# Patient Record
Sex: Female | Born: 1980 | Race: White | Hispanic: No | Marital: Married | Smoking: Never smoker
Health system: Southern US, Community
[De-identification: ages and names within clinical notes are randomized; demographics above are authoritative.]

## PROBLEM LIST (undated history)

## (undated) ENCOUNTER — Inpatient Hospital Stay (HOSPITAL_COMMUNITY): Payer: Self-pay

## (undated) DIAGNOSIS — O09529 Supervision of elderly multigravida, unspecified trimester: Secondary | ICD-10-CM

## (undated) DIAGNOSIS — R112 Nausea with vomiting, unspecified: Secondary | ICD-10-CM

## (undated) DIAGNOSIS — R1110 Vomiting, unspecified: Secondary | ICD-10-CM

## (undated) DIAGNOSIS — Z9889 Other specified postprocedural states: Secondary | ICD-10-CM

## (undated) DIAGNOSIS — O021 Missed abortion: Secondary | ICD-10-CM

## (undated) DIAGNOSIS — F419 Anxiety disorder, unspecified: Secondary | ICD-10-CM

## (undated) DIAGNOSIS — R51 Headache: Secondary | ICD-10-CM

## (undated) HISTORY — DX: Supervision of elderly multigravida, unspecified trimester: O09.529

## (undated) HISTORY — DX: Vomiting, unspecified: R11.10

---
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
## 2003-12-18 ENCOUNTER — Emergency Department (HOSPITAL_COMMUNITY): Admission: EM | Admit: 2003-12-18 | Discharge: 2003-12-18 | Payer: Self-pay | Admitting: Family Medicine

## 2005-04-20 ENCOUNTER — Other Ambulatory Visit: Admission: RE | Admit: 2005-04-20 | Discharge: 2005-04-20 | Payer: Self-pay | Admitting: Family Medicine

## 2006-04-22 ENCOUNTER — Other Ambulatory Visit: Admission: RE | Admit: 2006-04-22 | Discharge: 2006-04-22 | Payer: Self-pay | Admitting: Family Medicine

## 2007-05-10 ENCOUNTER — Other Ambulatory Visit: Admission: RE | Admit: 2007-05-10 | Discharge: 2007-05-10 | Payer: Self-pay | Admitting: Family Medicine

## 2008-05-14 ENCOUNTER — Other Ambulatory Visit: Admission: RE | Admit: 2008-05-14 | Discharge: 2008-05-14 | Payer: Self-pay | Admitting: Family Medicine

## 2009-01-26 ENCOUNTER — Emergency Department (HOSPITAL_COMMUNITY): Admission: EM | Admit: 2009-01-26 | Discharge: 2009-01-26 | Payer: Self-pay | Admitting: Emergency Medicine
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
## 2009-05-15 ENCOUNTER — Other Ambulatory Visit: Admission: RE | Admit: 2009-05-15 | Discharge: 2009-05-15 | Payer: Self-pay | Admitting: Family Medicine
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
## 2010-06-09 ENCOUNTER — Inpatient Hospital Stay (HOSPITAL_COMMUNITY)
Admission: AD | Admit: 2010-06-09 | Discharge: 2010-06-11 | Payer: Self-pay | Source: Home / Self Care | Attending: Obstetrics and Gynecology | Admitting: Obstetrics and Gynecology

## 2010-08-18 LAB — CBC
HCT: 38.7 % (ref 36.0–46.0)
MCH: 30.9 pg (ref 26.0–34.0)
MCHC: 34.4 g/dL (ref 30.0–36.0)
MCV: 91.3 fL (ref 78.0–100.0)
MCV: 91.9 fL (ref 78.0–100.0)
Platelets: 177 10*3/uL (ref 150–400)
Platelets: 230 10*3/uL (ref 150–400)
RBC: 3.69 MIL/uL — ABNORMAL LOW (ref 3.87–5.11)
RDW: 13.7 % (ref 11.5–15.5)
RDW: 13.7 % (ref 11.5–15.5)
WBC: 11.6 10*3/uL — ABNORMAL HIGH (ref 4.0–10.5)

## 2010-09-13 LAB — POCT I-STAT, CHEM 8
Calcium, Ion: 1.17 mmol/L (ref 1.12–1.32)
Chloride: 106 mEq/L (ref 96–112)
Glucose, Bld: 92 mg/dL (ref 70–99)
HCT: 36 % (ref 36.0–46.0)
Hemoglobin: 12.2 g/dL (ref 12.0–15.0)
TCO2: 25 mmol/L (ref 0–100)

## 2010-09-13 LAB — POCT PREGNANCY, URINE: Preg Test, Ur: NEGATIVE

## 2012-09-08 ENCOUNTER — Encounter (HOSPITAL_COMMUNITY): Payer: Self-pay | Admitting: Obstetrics and Gynecology

## 2012-09-12 ENCOUNTER — Encounter (HOSPITAL_COMMUNITY): Payer: Self-pay

## 2012-09-13 ENCOUNTER — Ambulatory Visit (HOSPITAL_COMMUNITY)
Admission: RE | Admit: 2012-09-13 | Discharge: 2012-09-13 | Disposition: A | Discharge status: Home / Self Care | Payer: BC Managed Care – PPO | Source: Ambulatory Visit | Attending: Obstetrics and Gynecology | Admitting: Obstetrics and Gynecology

## 2012-09-13 ENCOUNTER — Encounter (HOSPITAL_COMMUNITY): Payer: Self-pay | Admitting: *Deleted

## 2012-09-13 ENCOUNTER — Encounter (HOSPITAL_COMMUNITY): Payer: Self-pay | Admitting: Anesthesiology

## 2012-09-13 ENCOUNTER — Ambulatory Visit (HOSPITAL_COMMUNITY): Payer: BC Managed Care – PPO | Admitting: Anesthesiology

## 2012-09-13 ENCOUNTER — Encounter (HOSPITAL_COMMUNITY)
Admission: RE | Disposition: A | Discharge status: Home / Self Care | Payer: Self-pay | Source: Ambulatory Visit | Attending: Obstetrics and Gynecology

## 2012-09-13 DIAGNOSIS — O021 Missed abortion: Secondary | ICD-10-CM | POA: Insufficient documentation

## 2012-09-13 HISTORY — DX: Missed abortion: O02.1

## 2012-09-13 HISTORY — DX: Other specified postprocedural states: Z98.890

## 2012-09-13 HISTORY — DX: Nausea with vomiting, unspecified: R11.2

## 2012-09-13 HISTORY — DX: Headache: R51

## 2012-09-13 HISTORY — PX: DILATION AND EVACUATION: SHX1459

## 2012-09-13 LAB — ABO/RH: ABO/RH(D): O POS

## 2012-09-13 LAB — CBC
MCHC: 35.2 g/dL (ref 30.0–36.0)
Platelets: 252 10*3/uL (ref 150–400)
RDW: 12.6 % (ref 11.5–15.5)
WBC: 7.5 10*3/uL (ref 4.0–10.5)

## 2012-09-13 SURGERY — DILATION AND EVACUATION, UTERUS
Anesthesia: Monitor Anesthesia Care | Wound class: Clean Contaminated

## 2012-09-13 MED ORDER — KETOROLAC TROMETHAMINE 30 MG/ML IJ SOLN
INTRAMUSCULAR | Status: DC | PRN
  Administered 2012-09-13: 30 mg via INTRAVENOUS

## 2012-09-13 MED ORDER — FENTANYL CITRATE 0.05 MG/ML IJ SOLN
INTRAMUSCULAR | Status: DC | PRN
  Administered 2012-09-13: 100 ug via INTRAVENOUS

## 2012-09-13 MED ORDER — SCOPOLAMINE 1 MG/3DAYS TD PT72
MEDICATED_PATCH | TRANSDERMAL | Status: AC
  Administered 2012-09-13: 1.5 mg via TRANSDERMAL
  Filled 2012-09-13: qty 1

## 2012-09-13 MED ORDER — CHLOROPROCAINE HCL 1 % IJ SOLN
INTRAMUSCULAR | Status: AC
  Filled 2012-09-13: qty 30

## 2012-09-13 MED ORDER — CHLOROPROCAINE HCL 1 % IJ SOLN
INTRAMUSCULAR | Status: DC | PRN
  Administered 2012-09-13: 10 mL

## 2012-09-13 MED ORDER — MIDAZOLAM HCL 2 MG/2ML IJ SOLN
INTRAMUSCULAR | Status: AC
Start: 2012-09-13 — End: 2012-09-13
  Filled 2012-09-13: qty 2

## 2012-09-13 MED ORDER — FENTANYL CITRATE 0.05 MG/ML IJ SOLN
INTRAMUSCULAR | Status: AC
  Filled 2012-09-13: qty 2

## 2012-09-13 MED ORDER — DIPHENHYDRAMINE HCL 50 MG/ML IJ SOLN
INTRAMUSCULAR | Status: AC
  Filled 2012-09-13: qty 1

## 2012-09-13 MED ORDER — DIPHENHYDRAMINE HCL 50 MG/ML IJ SOLN
INTRAMUSCULAR | Status: DC | PRN
  Administered 2012-09-13: 12.5 mg via INTRAVENOUS

## 2012-09-13 MED ORDER — IBUPROFEN 200 MG PO TABS: 800.0000 mg | ORAL_TABLET | Freq: Three times a day (TID) | ORAL | Status: DC | PRN

## 2012-09-13 MED ORDER — SCOPOLAMINE 1 MG/3DAYS TD PT72: 1.0000 | MEDICATED_PATCH | TRANSDERMAL | Status: DC

## 2012-09-13 MED ORDER — FENTANYL CITRATE 0.05 MG/ML IJ SOLN: 25.0000 ug | INTRAMUSCULAR | Status: DC | PRN

## 2012-09-13 MED ORDER — HYDRALAZINE HCL 20 MG/ML IJ SOLN
INTRAMUSCULAR | Status: AC
  Filled 2012-09-13: qty 1

## 2012-09-13 MED ORDER — DEXAMETHASONE SODIUM PHOSPHATE 10 MG/ML IJ SOLN
INTRAMUSCULAR | Status: AC
  Filled 2012-09-13: qty 1

## 2012-09-13 MED ORDER — ONDANSETRON HCL 4 MG/2ML IJ SOLN
INTRAMUSCULAR | Status: DC | PRN
  Administered 2012-09-13: 4 mg via INTRAVENOUS

## 2012-09-13 MED ORDER — PROPOFOL 10 MG/ML IV EMUL
INTRAVENOUS | Status: DC | PRN
  Administered 2012-09-13 (×4): 20 mg via INTRAVENOUS

## 2012-09-13 MED ORDER — KETOROLAC TROMETHAMINE 30 MG/ML IJ SOLN
INTRAMUSCULAR | Status: AC
  Filled 2012-09-13: qty 1

## 2012-09-13 MED ORDER — KETOROLAC TROMETHAMINE 30 MG/ML IJ SOLN: 15.0000 mg | Freq: Once | INTRAMUSCULAR | Status: DC | PRN

## 2012-09-13 MED ORDER — LIDOCAINE HCL (CARDIAC) 20 MG/ML IV SOLN
INTRAVENOUS | Status: DC | PRN
  Administered 2012-09-13: 50 mg via INTRAVENOUS

## 2012-09-13 MED ORDER — METOCLOPRAMIDE HCL 5 MG/ML IJ SOLN
INTRAMUSCULAR | Status: DC | PRN
  Administered 2012-09-13: 10 mg via INTRAVENOUS

## 2012-09-13 MED ORDER — DEXAMETHASONE SODIUM PHOSPHATE 10 MG/ML IJ SOLN
INTRAMUSCULAR | Status: DC | PRN
  Administered 2012-09-13: 10 mg via INTRAVENOUS

## 2012-09-13 MED ORDER — METHYLERGONOVINE MALEATE 0.2 MG PO TABS: 0.2000 mg | ORAL_TABLET | Freq: Three times a day (TID) | ORAL | Status: DC

## 2012-09-13 MED ORDER — ONDANSETRON HCL 4 MG/2ML IJ SOLN
INTRAMUSCULAR | Status: AC
Start: 2012-09-13 — End: 2012-09-13
  Filled 2012-09-13: qty 2

## 2012-09-13 MED ORDER — LACTATED RINGERS IV SOLN
INTRAVENOUS | Status: DC
  Administered 2012-09-13 (×2): via INTRAVENOUS

## 2012-09-13 MED ORDER — METOCLOPRAMIDE HCL 5 MG/ML IJ SOLN
INTRAMUSCULAR | Status: AC
  Filled 2012-09-13: qty 2

## 2012-09-13 MED ORDER — METOCLOPRAMIDE HCL 5 MG/ML IJ SOLN: 10.0000 mg | Freq: Once | INTRAMUSCULAR | Status: DC | PRN

## 2012-09-13 MED ORDER — PROPOFOL 10 MG/ML IV EMUL
INTRAVENOUS | Status: AC
  Filled 2012-09-13: qty 20

## 2012-09-13 MED ORDER — MIDAZOLAM HCL 5 MG/5ML IJ SOLN
INTRAMUSCULAR | Status: DC | PRN
  Administered 2012-09-13: 2 mg via INTRAVENOUS

## 2012-09-13 MED ORDER — LIDOCAINE HCL (CARDIAC) 20 MG/ML IV SOLN
INTRAVENOUS | Status: AC
Start: 2012-09-13 — End: 2012-09-13
  Filled 2012-09-13: qty 5

## 2012-09-13 SURGICAL SUPPLY — 22 items
CATH ROBINSON RED A/P 16FR (CATHETERS) ×2 IMPLANT
CLOTH BEACON ORANGE TIMEOUT ST (SAFETY) ×2 IMPLANT
DECANTER SPIKE VIAL GLASS SM (MISCELLANEOUS) ×2 IMPLANT
GLOVE BIO SURGEON STRL SZ 6.5 (GLOVE) ×2 IMPLANT
GLOVE BIOGEL PI IND STRL 7.0 (GLOVE) ×1 IMPLANT
GLOVE BIOGEL PI INDICATOR 7.0 (GLOVE) ×1
GLOVE SURG SS PI 7.0 STRL IVOR (GLOVE) ×2 IMPLANT
GOWN STRL REIN XL XLG (GOWN DISPOSABLE) ×4 IMPLANT
KIT BERKELEY 1ST TRIMESTER 3/8 (MISCELLANEOUS) ×2 IMPLANT
NDL SPNL 22GX3.5 QUINCKE BK (NEEDLE) ×1 IMPLANT
NEEDLE SPNL 22GX3.5 QUINCKE BK (NEEDLE) ×2 IMPLANT
NS IRRIG 1000ML POUR BTL (IV SOLUTION) ×2 IMPLANT
PACK VAGINAL MINOR WOMEN LF (CUSTOM PROCEDURE TRAY) ×2 IMPLANT
PAD OB MATERNITY 4.3X12.25 (PERSONAL CARE ITEMS) ×2 IMPLANT
PAD PREP 24X48 CUFFED NSTRL (MISCELLANEOUS) ×2 IMPLANT
SET BERKELEY SUCTION TUBING (SUCTIONS) ×2 IMPLANT
SYR CONTROL 10ML LL (SYRINGE) ×2 IMPLANT
TOWEL OR 17X24 6PK STRL BLUE (TOWEL DISPOSABLE) ×4 IMPLANT
VACURETTE 10 RIGID CVD (CANNULA) IMPLANT
VACURETTE 7MM CVD STRL WRAP (CANNULA) ×1 IMPLANT
VACURETTE 8 RIGID CVD (CANNULA) IMPLANT
VACURETTE 9 RIGID CVD (CANNULA) IMPLANT

## 2012-09-13 NOTE — Op Note (Signed)
NAMEEDWARD, TREVINO             ACCOUNT NO.:  [id number]  MEDICAL RECORD NO.:  [id number]  LOCATION:  [facility]                          FACILITY:  [facility]  PHYSICIAN:  Zelphia Cairo, MD    DATE OF BIRTH:  07-Mar-1981  DATE OF PROCEDURE:  09/13/2012 DATE OF DISCHARGE:  09/13/2012                              OPERATIVE REPORT   PREOPERATIVE DIAGNOSIS:  Missed abortion.  PROCEDURES: 1. Cervical block. 2. Dilation and evacuation.  SURGEON:  Zelphia Cairo, MD  ANESTHESIA:  MAC with local.  SPECIMEN:  Products of conception.  COMPLICATIONS:  None.  CONDITION:  Stable to recovery room.  DESCRIPTION OF PROCEDURE:  The patient was taken to the operating room after informed consent was obtained.  She was given anesthesia and placed in the dorsal lithotomy position using Allen stirrups.  She was prepped and draped in sterile fashion, and an in-and-out catheter was used to drain her bladder for unmeasured amount urine.  Bivalve speculum was placed in the vagina and a single-tooth tenaculum was placed on the anterior lip of the cervix.  A cervical block was performed using 1% Nesacaine.  The cervix was easily dilated using Pratt dilators and a 7- French suction catheter was inserted into the endometrial cavity and products of conception were evacuated from the uterine cavity.  A gentle curettage was then performed, and the uterine cry was noted throughout. The suction catheter was reinserted to remove any clots and debris and remaining tissue.  The tenaculum was removed.  There was some slight oozing from the tenaculum site, and this was made hemostatic with pressure using a ring forceps.  Once the ring forceps was removed, the cervix was hemostatic.  The speculum was removed and the patient was taken to the recovery room in stable condition.  Sponge, lap, needle, and instrument counts were correct x2.     Zelphia Cairo, MD    GA/MEDQ  D:  09/13/2012  T:  09/13/2012  Job:   [id number]

## 2012-09-13 NOTE — Anesthesia Postprocedure Evaluation (Signed)
Anesthesia Post Note  Patient: Carla Edwards  Procedure(s) Performed: Procedure(s) (LRB): DILATATION AND EVACUATION (N/A)  Anesthesia type: MAC  Patient location: PACU  Post pain: Pain level controlled  Post assessment: Post-op Vital signs reviewed  Last Vitals:  Filed Vitals:   09/13/12 0830  BP: 104/58  Pulse: 65  Temp:   Resp: 22    Post vital signs: Reviewed  Level of consciousness: sedated  Complications: No apparent anesthesia complications

## 2012-09-13 NOTE — Transfer of Care (Signed)
Immediate Anesthesia Transfer of Care Note  Patient: Carla Edwards  Procedure(s) Performed: Procedure(s): DILATATION AND EVACUATION (N/A)  Patient Location: PACU  Anesthesia Type:MAC  Level of Consciousness: sedated  Airway & Oxygen Therapy: Patient Spontanous Breathing and Patient connected to nasal cannula oxygen  Post-op Assessment: Report given to PACU RN  Post vital signs: Reviewed and stable  Complications: No apparent anesthesia complications

## 2012-09-13 NOTE — H&P (Signed)
32 yo G2P1 presents for surgical mngt of missed Ab  PMHx:  Negative PSHx:  Knee surgery, delivery All:  Sulfa, loracet, bee Meds:  PNV FHx:  N/c  AF, VSS Gen - NAD ABd - soft, NT/ND Cv - RRR LUngs - clear Ext - no edema PV - deferred  [place] - IUP, [redacted] wks gestation, no FHT  A/P:  Missed Ab Plan for D&E.  R/B/A discussed, informed consent

## 2012-09-13 NOTE — Anesthesia Preprocedure Evaluation (Signed)
Anesthesia Evaluation  Patient identified by MRN, date of birth, ID band Patient awake    Reviewed: Allergy & Precautions, H&P , NPO status , Patient's Chart, lab work & pertinent test results, reviewed documented beta blocker date and time   History of Anesthesia Complications (+) PONV  Airway Mallampati: I TM Distance: >3 FB Neck ROM: full    Dental  (+) Teeth Intact   Pulmonary neg pulmonary ROS,  breath sounds clear to auscultation        Cardiovascular negative cardio ROS  Rhythm:regular Rate:Normal     Neuro/Psych negative neurological ROS  negative psych ROS   GI/Hepatic negative GI ROS, Neg liver ROS,   Endo/Other  negative endocrine ROS  Renal/GU negative Renal ROS  negative genitourinary   Musculoskeletal   Abdominal   Peds  Hematology negative hematology ROS (+)   Anesthesia Other Findings   Reproductive/Obstetrics (+) Pregnancy                           Anesthesia Physical Anesthesia Plan  ASA: II  Anesthesia Plan: MAC   Post-op Pain Management:    Induction:   Airway Management Planned:   Additional Equipment:   Intra-op Plan:   Post-operative Plan:   Informed Consent: I have reviewed the patients History and Physical, chart, labs and discussed the procedure including the risks, benefits and alternatives for the proposed anesthesia with the patient or authorized representative who has indicated his/her understanding and acceptance.     Plan Discussed with: Surgeon and CRNA  Anesthesia Plan Comments:         Anesthesia Quick Evaluation

## 2012-09-14 ENCOUNTER — Encounter (HOSPITAL_COMMUNITY): Payer: Self-pay | Admitting: Obstetrics and Gynecology

## 2012-10-11 ENCOUNTER — Other Ambulatory Visit: Payer: Self-pay | Admitting: Dermatology

## 2013-01-16 LAB — OB RESULTS CONSOLE RUBELLA ANTIBODY, IGM: RUBELLA: IMMUNE

## 2013-01-16 LAB — OB RESULTS CONSOLE ABO/RH: RH TYPE: POSITIVE

## 2013-01-16 LAB — OB RESULTS CONSOLE HIV ANTIBODY (ROUTINE TESTING): HIV: NONREACTIVE

## 2013-01-16 LAB — OB RESULTS CONSOLE GC/CHLAMYDIA
Chlamydia: NEGATIVE
Gonorrhea: NEGATIVE

## 2013-01-16 LAB — OB RESULTS CONSOLE RPR: RPR: NONREACTIVE

## 2013-01-16 LAB — OB RESULTS CONSOLE ANTIBODY SCREEN: Antibody Screen: NEGATIVE

## 2013-01-16 LAB — OB RESULTS CONSOLE HEPATITIS B SURFACE ANTIGEN: HEP B S AG: NEGATIVE

## 2013-02-08 ENCOUNTER — Inpatient Hospital Stay (HOSPITAL_COMMUNITY)
Admission: AD | Admit: 2013-02-08 | Discharge: 2013-02-08 | Disposition: A | Discharge status: Home / Self Care | Payer: BC Managed Care – PPO | Source: Ambulatory Visit | Attending: Obstetrics and Gynecology | Admitting: Obstetrics and Gynecology

## 2013-02-08 ENCOUNTER — Encounter (HOSPITAL_COMMUNITY): Payer: Self-pay | Admitting: *Deleted

## 2013-02-08 DIAGNOSIS — O210 Mild hyperemesis gravidarum: Secondary | ICD-10-CM | POA: Insufficient documentation

## 2013-02-08 DIAGNOSIS — O219 Vomiting of pregnancy, unspecified: Secondary | ICD-10-CM

## 2013-02-08 DIAGNOSIS — R51 Headache: Secondary | ICD-10-CM | POA: Insufficient documentation

## 2013-02-08 HISTORY — DX: Anxiety disorder, unspecified: F41.9

## 2013-02-08 LAB — URINALYSIS, ROUTINE W REFLEX MICROSCOPIC
Bilirubin Urine: NEGATIVE
Glucose, UA: NEGATIVE mg/dL
Hgb urine dipstick: NEGATIVE
Specific Gravity, Urine: 1.02 (ref 1.005–1.030)
pH: 6.5 (ref 5.0–8.0)

## 2013-02-08 MED ORDER — ONDANSETRON HCL 4 MG/2ML IJ SOLN
4.0000 mg | Freq: Once | INTRAMUSCULAR | Status: AC
  Administered 2013-02-08: 4 mg via INTRAVENOUS
  Filled 2013-02-08: qty 2

## 2013-02-08 MED ORDER — PYRIDOXINE HCL 100 MG/ML IJ SOLN
100.0000 mg | Freq: Once | INTRAMUSCULAR | Status: AC
  Administered 2013-02-08: 100 mg via INTRAVENOUS
  Filled 2013-02-08: qty 1

## 2013-02-08 MED ORDER — LACTATED RINGERS IV SOLN
INTRAVENOUS | Status: DC
  Administered 2013-02-08: 19:00:00 via INTRAVENOUS

## 2013-02-08 MED ORDER — VITAMIN B-6 50 MG PO TABS: 50.0000 mg | ORAL_TABLET | Freq: Three times a day (TID) | ORAL | Status: DC

## 2013-02-08 MED ORDER — PANTOPRAZOLE SODIUM 40 MG PO TBEC: 40.0000 mg | DELAYED_RELEASE_TABLET | Freq: Every day | ORAL | Status: DC

## 2013-02-08 MED ORDER — FAMOTIDINE IN NACL 20-0.9 MG/50ML-% IV SOLN
20.0000 mg | Freq: Once | INTRAVENOUS | Status: AC
  Administered 2013-02-08: 20 mg via INTRAVENOUS
  Filled 2013-02-08: qty 50

## 2013-02-08 NOTE — MAU Note (Signed)
Ongoing nausea and vomiting. Last 5 days has not kept much of anything down.  Has a headache, only peed twice today.

## 2013-02-08 NOTE — MAU Note (Signed)
Patient c/o of nausea and vomiting since Saturday. Patient couldn't keep much food down except ice chips today. Patient takes zofran at home but didn't work today. Patient denies lof and vaginal bleeding. Patient also reports headaches for past couple of days.

## 2013-02-08 NOTE — MAU Provider Note (Signed)
History     CSN: [id number]  Arrival date and time: 02/08/13 1707   None     Chief Complaint  Patient presents with  . Emesis During Pregnancy   HPI This is a 32 y.o. female at [redacted]w[redacted]d who presents with exacerbation of ongoing nausea and vomiting. Has been worse over past 5 days. Dehydrated  RN Note:     Ongoing nausea and vomiting. Last 5 days has not kept much of anything down. Has a headache, only peed twice today.       OB History   Grav Para Term Preterm Abortions TAB SAB Ect Mult Living   3 1 1  1  1   1       Past Medical History  Diagnosis Date  . Missed ab   . PONV (postoperative nausea and vomiting)     slow to awaken  . Headache(784.0)   . Anxiety     Past Surgical History  Procedure Laterality Date  . Knee surgery  1993  . Wisdom tooth extraction  98  . Dilation and evacuation N/A 09/13/2012    Procedure: DILATATION AND EVACUATION;  Surgeon: Zelphia Cairo, MD;  Location: [facility];  Service: Gynecology;  Laterality: N/A;    History reviewed. No pertinent family history.  History  Substance Use Topics  . Smoking status: Never Smoker   . Smokeless tobacco: Not on file  . Alcohol Use: No    Allergies:  Allergies  Allergen Reactions  . Sulfa Antibiotics Anaphylaxis    Tongue swelling  . Lorcet 10-650 [Hydrocodone-Acetaminophen]     Does not remember-as child    Prescriptions prior to admission  Medication Sig Dispense Refill  . acetaminophen (TYLENOL) 325 MG tablet Take 650 mg by mouth every 6 (six) hours as needed for pain.      Marland Kitchen ondansetron (ZOFRAN-ODT) 8 MG disintegrating tablet Take 8 mg by mouth daily as needed for nausea.      . Prenatal Vit-Fe Fumarate-FA (PRENATAL MULTIVITAMIN) TABS Take 1 tablet by mouth daily at 12 noon.      . progesterone 200 MG SUPP Place 200 mg vaginally at bedtime.      . [DISCONTINUED] ibuprofen (ADVIL) 200 MG tablet Take 4 tablets (800 mg total) by mouth every 8 (eight) hours as needed for pain.  30 tablet  0   . [DISCONTINUED] methylergonovine (METHERGINE) 0.2 MG tablet Take 1 tablet (0.2 mg total) by mouth every 8 (eight) hours.  9 tablet  0    Review of Systems  Constitutional: Positive for malaise/fatigue. Negative for fever and chills.  Gastrointestinal: Positive for nausea and vomiting. Negative for abdominal pain, diarrhea and constipation.  Neurological: Positive for dizziness and weakness.   Physical Exam   Blood pressure 135/83, pulse 65, temperature 98.3 F (36.8 C), temperature source Oral, resp. rate 18, weight 57.244 kg (126 lb 3.2 oz), last menstrual period 11/16/2012.  Physical Exam  Constitutional: She appears well-developed and well-nourished. No distress.  Cardiovascular: Normal rate.   Respiratory: Effort normal.  GI: Soft.  Musculoskeletal: Normal range of motion.  Neurological: She is alert.  Skin: Skin is warm and dry.  Psychiatric: She has a normal mood and affect.    MAU Course  Procedures   Assessment and Plan  A:  SIUP at [redacted]w[redacted]d        Hyperemesis  P:  WIll give IV hydration and meds      Report to Pamella Pert  [facility] 02/08/2013, 5:48 PM   Care  of pt assumed at 2000. Pt feeling much better. Tolerating POs.   D/C home in stable condition.  Follow-up Information   Follow up with [facility] [place], [place].. (as scheduled)    Contact information:   [address] [place] [address] [phone]      Follow up with [facility] [place] MATERNITY ADMISS[facility]. (As needed if symptoms worsen)    Contact information:   [address] [id number] [place] [address] [phone]       Medication List    STOP taking these medications       ibuprofen 200 MG tablet  Commonly known as:  ADVIL     methylergonovine 0.2 MG tablet  Commonly known as:  METHERGINE      TAKE these medications       acetaminophen 325 MG tablet  Commonly known as:  TYLENOL  Take 650 mg by mouth every 6  (six) hours as needed for pain.     ondansetron 8 MG disintegrating tablet  Commonly known as:  ZOFRAN-ODT  Take 8 mg by mouth daily as needed for nausea.     pantoprazole 40 MG tablet  Commonly known as:  PROTONIX  Take 1 tablet (40 mg total) by mouth daily.     prenatal multivitamin Tabs tablet  Take 1 tablet by mouth daily at 12 noon.     progesterone 200 MG Supp  Place 200 mg vaginally at bedtime.     pyridOXINE 50 MG tablet  Commonly known as:  VITAMIN B-6  Take 1 tablet (50 mg total) by mouth 3 (three) times daily.       [place], [place] 02/08/2013 8:19 PM

## 2013-04-04 ENCOUNTER — Encounter (HOSPITAL_COMMUNITY): Payer: Self-pay | Admitting: *Deleted

## 2013-04-04 ENCOUNTER — Inpatient Hospital Stay (HOSPITAL_COMMUNITY)
Admission: AD | Admit: 2013-04-04 | Discharge: 2013-04-04 | Disposition: A | Discharge status: Home / Self Care | Payer: BC Managed Care – PPO | Source: Ambulatory Visit | Attending: Obstetrics and Gynecology | Admitting: Obstetrics and Gynecology

## 2013-04-04 DIAGNOSIS — O99891 Other specified diseases and conditions complicating pregnancy: Secondary | ICD-10-CM | POA: Insufficient documentation

## 2013-04-04 DIAGNOSIS — O26899 Other specified pregnancy related conditions, unspecified trimester: Secondary | ICD-10-CM

## 2013-04-04 DIAGNOSIS — R109 Unspecified abdominal pain: Secondary | ICD-10-CM

## 2013-04-04 LAB — URINALYSIS, ROUTINE W REFLEX MICROSCOPIC
Glucose, UA: NEGATIVE mg/dL
Hgb urine dipstick: NEGATIVE
Leukocytes, UA: NEGATIVE
Specific Gravity, Urine: >1.03 — ABNORMAL HIGH (ref 1.005–1.030)

## 2013-04-04 NOTE — MAU Provider Note (Signed)
History     CSN: [id number]  Arrival date and time: 04/04/13 4098   First Provider Initiated Contact with Patient 04/04/13 1956      Chief Complaint  Patient presents with  . Abdominal Pain   HPI Pt is [redacted]w[redacted]d pregnant G3P1011 with c/o lower abd cramping.  Pt had IC this morning about 7 am and then started having  Cramping later in the morning and and continued this afternoon.  Pt has had morning sickness throughout her pregnancy But has felt a little better today and able to tolerate PO fluids and food.  Pt has been taking Zofran for nausea which caused  Constipation, but bowel movements have been more regular over the past 2 days with decrease in zofran.   Pt denies vaginal discharge, spotting or bleeding, UTI sx, diarrhea, fever or chills. Pt had an uneventful prior pregnancy.  Past Medical History  Diagnosis Date  . Missed ab   . PONV (postoperative nausea and vomiting)     slow to awaken  . Headache(784.0)   . Anxiety     Past Surgical History  Procedure Laterality Date  . Knee surgery  1993  . Wisdom tooth extraction  98  . Dilation and evacuation N/A 09/13/2012    Procedure: DILATATION AND EVACUATION;  Surgeon: Zelphia Cairo, MD;  Location: [facility];  Service: Gynecology;  Laterality: N/A;    History reviewed. No pertinent family history.  History  Substance Use Topics  . Smoking status: Never Smoker   . Smokeless tobacco: Not on file  . Alcohol Use: No    Allergies:  Allergies  Allergen Reactions  . Sulfa Antibiotics Anaphylaxis    Tongue swelling  . Lorcet 10-650 [Hydrocodone-Acetaminophen]     Does not remember-as child    Prescriptions prior to admission  Medication Sig Dispense Refill  . acetaminophen (TYLENOL) 325 MG tablet Take 650 mg by mouth every 6 (six) hours as needed for pain.      Marland Kitchen ondansetron (ZOFRAN-ODT) 8 MG disintegrating tablet Take 8 mg by mouth daily as needed for nausea.      . Prenatal Vit-Fe Fumarate-FA (PRENATAL MULTIVITAMIN)  TABS Take 1 tablet by mouth daily at 12 noon.      . pantoprazole (PROTONIX) 40 MG tablet Take 1 tablet (40 mg total) by mouth daily.  30 tablet  2  . progesterone 200 MG SUPP Place 200 mg vaginally at bedtime.      . pyridOXINE (VITAMIN B-6) 50 MG tablet Take 1 tablet (50 mg total) by mouth 3 (three) times daily.        Review of Systems  Constitutional: Negative for fever and chills.  Gastrointestinal: Positive for nausea and abdominal pain. Negative for vomiting, diarrhea and constipation.  Genitourinary: Negative for dysuria and urgency.   Physical Exam   Blood pressure 138/79, pulse 76, temperature 98.9 F (37.2 C), temperature source Oral, resp. rate 18, height 5\' 4"  (1.626 m), weight 60.238 kg (132 lb 12.8 oz), last menstrual period 11/16/2012, SpO2 100.00%.  Physical Exam  Nursing note and vitals reviewed. Constitutional: She appears well-developed and well-nourished. No distress.  HENT:  Head: Normocephalic.  Eyes: Pupils are equal, round, and reactive to light.  Neck: Normal range of motion. Neck supple.  Cardiovascular: Normal rate.   Respiratory: Effort normal.  GI: Soft.  Genitourinary:  Cervix long and closed  Musculoskeletal: Normal range of motion.  Neurological: She is alert.  Skin: Skin is warm and dry.    MAU Course  Procedures  Results for orders placed during the hospital encounter of 04/04/13 (from the past 24 hour(s))  URINALYSIS, ROUTINE W REFLEX MICROSCOPIC     Status: Abnormal   Collection Time    04/04/13  7:21 PM      Result Value Range   Color, Urine YELLOW  YELLOW   APPearance CLEAR  CLEAR   Specific Gravity, Urine >1.030 (*) 1.005 - 1.030   pH 6.0  5.0 - 8.0   Glucose, UA NEGATIVE  NEGATIVE mg/dL   Hgb urine dipstick NEGATIVE  NEGATIVE   Bilirubin Urine NEGATIVE  NEGATIVE   Ketones, ur NEGATIVE  NEGATIVE mg/dL   Protein, ur NEGATIVE  NEGATIVE mg/dL   Urobilinogen, UA 0.2  0.0 - 1.0 mg/dL   Nitrite NEGATIVE  NEGATIVE   Leukocytes, UA  NEGATIVE  NEGATIVE   Discussed with Dr. Vincente Poli- Pt may d/c home   Assessment and Plan  Abdominal pain in pregnancy- encouraged increase in fluids Tylenol for discomfort F/u with OB appointment  [facility] 04/04/2013, 7:56 PM

## 2013-04-04 NOTE — MAU Note (Signed)
PT SAYS SHE STARTED CRAMPING  AT 1100 AND BECAME WORSE AT 4-5 PM.      SAYS SHE STILL HAS MORNING SICKNESS-    VOMITED X1 TODAY. NO DIARRHEA.   LAST  SEX- THIS MORN BEFORE CRAMPING STARTED .    NO BLEEDING.  SHE CALLED OFFICE-  TOLD TO COME IN.     CRAMPING IS STILL SAME NOW AS WAS THIS AM.

## 2013-04-04 NOTE — MAU Note (Signed)
Sharp lower abdominal pain since this morning. Denies LOF or VB. Denies urinary symptoms.

## 2013-06-05 ENCOUNTER — Other Ambulatory Visit (HOSPITAL_COMMUNITY): Payer: Self-pay | Admitting: Obstetrics and Gynecology

## 2013-06-05 DIAGNOSIS — Z3689 Encounter for other specified antenatal screening: Secondary | ICD-10-CM

## 2013-06-05 DIAGNOSIS — O283 Abnormal ultrasonic finding on antenatal screening of mother: Secondary | ICD-10-CM

## 2013-06-08 NOTE — L&D Delivery Note (Signed)
Delivery Note At 10:45 PM a viable female was delivered via  OA Presentation Apgars 9 9 weight pending Placenta status:spontaneously with 3 vessel cord and intact , .  Cord:  with the following complications:none .  Cord pH: not done  Anesthesia: Epidural  Episiotomy: none Lacerations: none Suture Repair: not applicable Est. Blood Loss (mL): 300  Mom to postpartum.  Baby to Couplet care / Skin to Skin.  Reola Buckles L 08/08/2013, 10:54 PM

## 2013-06-14 ENCOUNTER — Ambulatory Visit (HOSPITAL_COMMUNITY)
Admission: RE | Admit: 2013-06-14 | Discharge: 2013-06-14 | Disposition: A | Discharge status: Home / Self Care | Payer: BC Managed Care – PPO | Source: Ambulatory Visit | Attending: Obstetrics and Gynecology | Admitting: Obstetrics and Gynecology

## 2013-06-14 DIAGNOSIS — O358XX0 Maternal care for other (suspected) fetal abnormality and damage, not applicable or unspecified: Secondary | ICD-10-CM | POA: Insufficient documentation

## 2013-06-14 DIAGNOSIS — Z3689 Encounter for other specified antenatal screening: Secondary | ICD-10-CM

## 2013-06-14 DIAGNOSIS — Z363 Encounter for antenatal screening for malformations: Secondary | ICD-10-CM | POA: Insufficient documentation

## 2013-06-14 DIAGNOSIS — Z1389 Encounter for screening for other disorder: Secondary | ICD-10-CM | POA: Insufficient documentation

## 2013-06-14 DIAGNOSIS — O283 Abnormal ultrasonic finding on antenatal screening of mother: Secondary | ICD-10-CM

## 2013-07-05 ENCOUNTER — Ambulatory Visit (HOSPITAL_COMMUNITY): Payer: BC Managed Care – PPO

## 2013-07-21 LAB — OB RESULTS CONSOLE GBS: GBS: NEGATIVE

## 2013-07-28 ENCOUNTER — Other Ambulatory Visit (HOSPITAL_COMMUNITY): Payer: Self-pay | Admitting: Obstetrics and Gynecology

## 2013-07-28 DIAGNOSIS — O358XX0 Maternal care for other (suspected) fetal abnormality and damage, not applicable or unspecified: Secondary | ICD-10-CM

## 2013-08-01 ENCOUNTER — Ambulatory Visit (HOSPITAL_COMMUNITY): Payer: BC Managed Care – PPO

## 2013-08-04 DIAGNOSIS — O331 Maternal care for disproportion due to generally contracted pelvis: Secondary | ICD-10-CM | POA: Insufficient documentation

## 2013-08-05 ENCOUNTER — Encounter (HOSPITAL_COMMUNITY): Payer: Self-pay | Admitting: *Deleted

## 2013-08-05 ENCOUNTER — Inpatient Hospital Stay (HOSPITAL_COMMUNITY): Payer: BC Managed Care – PPO

## 2013-08-05 ENCOUNTER — Inpatient Hospital Stay (HOSPITAL_COMMUNITY)
Admission: AD | Admit: 2013-08-05 | Discharge: 2013-08-05 | Disposition: A | Discharge status: Home / Self Care | Payer: BC Managed Care – PPO | Source: Ambulatory Visit | Attending: Obstetrics and Gynecology | Admitting: Obstetrics and Gynecology

## 2013-08-05 NOTE — MAU Provider Note (Signed)
Ms. Kripa Foskey is a 33 y.o. G3P1011 at [redacted]w[redacted]d who presents to MAU today with complaint of possible ROM. The patient states that she has had a lot of mucus discharge over the last 2 days and thought she may also be leaking fluid. She notes occasional irregular contractions. She reports good fetal movement.   BP 126/74  Pulse 91  Temp(Src) 98.2 F (36.8 C)  Resp 18  Ht 5\' 4"  (1.626 m)  Wt 151 lb (68.493 kg)  BMI 25.91 kg/m2  SpO2 100%  LMP 11/16/2012 GENERAL: Well-developed, well-nourished female in no acute distress.  HEENT: Normocephalic, atraumatic. LUNGS: Normal effort  HEART: Regular rate  ABDOMEN: Soft, nontender, nondistended.  PELVIC: Normal external female genitalia. Vagina is pink and rugated. Moderate amount of thick, mucus discharge and scant thin, white discharge noted. Negative pooling. Gravid uterus appropriate for GA.  EXTREMITIES: No cyanosis, clubbing, or edema Dilation: 2 Exam by:: Almyra Free Either PA  Ferning - negative  Fetal Monitoring: Baseline: 130 bpm, moderate variability, + accelerations, no decelerations Contractions: mild UI   A: Membrane intact  P: RN to report to MD on call for further instructions.   Farris Has, PA-C 08/05/2013 12:41 AM

## 2013-08-05 NOTE — MAU Note (Signed)
Fern negative.

## 2013-08-05 NOTE — MAU Note (Signed)
Water broke? Trickles that started around 2030.

## 2013-08-05 NOTE — Discharge Instructions (Signed)
Third Trimester of Pregnancy °The third trimester is from week 29 through week 42, months 7 through 9. The third trimester is a time when the fetus is growing rapidly. At the end of the ninth month, the fetus is about 20 inches in length and weighs 6 10 pounds.  °BODY CHANGES °Your body goes through many changes during pregnancy. The changes vary from woman to woman.  °· Your weight will continue to increase. You can expect to gain 25 35 pounds (11 16 kg) by the end of the pregnancy. °· You may begin to get stretch marks on your hips, abdomen, and breasts. °· You may urinate more often because the fetus is moving lower into your pelvis and pressing on your bladder. °· You may develop or continue to have heartburn as a result of your pregnancy. °· You may develop constipation because certain hormones are causing the muscles that push waste through your intestines to slow down. °· You may develop hemorrhoids or swollen, bulging veins (varicose veins). °· You may have pelvic pain because of the weight gain and pregnancy hormones relaxing your joints between the bones in your pelvis. Back aches may result from over exertion of the muscles supporting your posture. °· Your breasts will continue to grow and be tender. A yellow discharge may leak from your breasts called colostrum. °· Your belly button may stick out. °· You may feel short of breath because of your expanding uterus. °· You may notice the fetus "dropping," or moving lower in your abdomen. °· You may have a bloody mucus discharge. This usually occurs a few days to a week before labor begins. °· Your cervix becomes thin and soft (effaced) near your due date. °WHAT TO EXPECT AT YOUR PRENATAL EXAMS  °You will have prenatal exams every 2 weeks until week 36. Then, you will have weekly prenatal exams. During a routine prenatal visit: °· You will be weighed to make sure you and the fetus are growing normally. °· Your blood pressure is taken. °· Your abdomen will be  measured to track your baby's growth. °· The fetal heartbeat will be listened to. °· Any test results from the previous visit will be discussed. °· You may have a cervical check near your due date to see if you have effaced. °At around 36 weeks, your caregiver will check your cervix. At the same time, your caregiver will also perform a test on the secretions of the vaginal tissue. This test is to determine if a type of bacteria, Group B streptococcus, is present. Your caregiver will explain this further. °Your caregiver may ask you: °· What your birth plan is. °· How you are feeling. °· If you are feeling the baby move. °· If you have had any abnormal symptoms, such as leaking fluid, bleeding, severe headaches, or abdominal cramping. °· If you have any questions. °Other tests or screenings that may be performed during your third trimester include: °· Blood tests that check for low iron levels (anemia). °· Fetal testing to check the health, activity level, and growth of the fetus. Testing is done if you have certain medical conditions or if there are problems during the pregnancy. °FALSE LABOR °You may feel small, irregular contractions that eventually go away. These are called Braxton Hicks contractions, or false labor. Contractions may last for hours, days, or even weeks before true labor sets in. If contractions come at regular intervals, intensify, or become painful, it is best to be seen by your caregiver.  °  SIGNS OF LABOR  °· Menstrual-like cramps. °· Contractions that are 5 minutes apart or less. °· Contractions that start on the top of the uterus and spread down to the lower abdomen and back. °· A sense of increased pelvic pressure or back pain. °· A watery or bloody mucus discharge that comes from the vagina. °If you have any of these signs before the 37th week of pregnancy, call your caregiver right away. You need to go to the hospital to get checked immediately. °HOME CARE INSTRUCTIONS  °· Avoid all  smoking, herbs, alcohol, and unprescribed drugs. These chemicals affect the formation and growth of the baby. °· Follow your caregiver's instructions regarding medicine use. There are medicines that are either safe or unsafe to take during pregnancy. °· Exercise only as directed by your caregiver. Experiencing uterine cramps is a good sign to stop exercising. °· Continue to eat regular, healthy meals. °· Wear a good support bra for breast tenderness. °· Do not use hot tubs, steam rooms, or saunas. °· Wear your seat belt at all times when driving. °· Avoid raw meat, uncooked cheese, cat litter boxes, and soil used by cats. These carry germs that can cause birth defects in the baby. °· Take your prenatal vitamins. °· Try taking a stool softener (if your caregiver approves) if you develop constipation. Eat more high-fiber foods, such as fresh vegetables or fruit and whole grains. Drink plenty of fluids to keep your urine clear or pale yellow. °· Take warm sitz baths to soothe any pain or discomfort caused by hemorrhoids. Use hemorrhoid cream if your caregiver approves. °· If you develop varicose veins, wear support hose. Elevate your feet for 15 minutes, 3 4 times a day. Limit salt in your diet. °· Avoid heavy lifting, wear low heal shoes, and practice good posture. °· Rest a lot with your legs elevated if you have leg cramps or low back pain. °· Visit your dentist if you have not gone during your pregnancy. Use a soft toothbrush to brush your teeth and be gentle when you floss. °· A sexual relationship may be continued unless your caregiver directs you otherwise. °· Do not travel far distances unless it is absolutely necessary and only with the approval of your caregiver. °· Take prenatal classes to understand, practice, and ask questions about the labor and delivery. °· Make a trial run to the hospital. °· Pack your hospital bag. °· Prepare the baby's nursery. °· Continue to go to all your prenatal visits as directed  by your caregiver. °SEEK MEDICAL CARE IF: °· You are unsure if you are in labor or if your water has broken. °· You have dizziness. °· You have mild pelvic cramps, pelvic pressure, or nagging pain in your abdominal area. °· You have persistent nausea, vomiting, or diarrhea. °· You have a bad smelling vaginal discharge. °· You have pain with urination. °SEEK IMMEDIATE MEDICAL CARE IF:  °· You have a fever. °· You are leaking fluid from your vagina. °· You have spotting or bleeding from your vagina. °· You have severe abdominal cramping or pain. °· You have rapid weight loss or gain. °· You have shortness of breath with chest pain. °· You notice sudden or extreme swelling of your face, hands, ankles, feet, or legs. °· You have not felt your baby move in over an hour. °· You have severe headaches that do not go away with medicine. °· You have vision changes. °Document Released: 05/19/2001 Document Revised: 01/25/2013 Document Reviewed:   You have severe abdominal cramping or pain.   You have rapid weight loss or gain.   You have shortness of breath with chest pain.   You notice sudden or extreme swelling of your face, hands, ankles, feet, or legs.   You have not felt your baby move in over an hour.   You have severe headaches that do not go away with medicine.   You have vision changes.  Document Released: 05/19/2001 Document Revised: 01/25/2013 Document Reviewed: 07/26/2012  [employer] Patient Information 2014 [employer].

## 2013-08-05 NOTE — Progress Notes (Addendum)
Dr. Radene Knee notified, [phone] patient prefers not to have the BPP due to out of pocket expense. Fetal tracing reassuring with 15x15 accelerations but not two in a 20 minute period at this time. Does have good variability with occasional small variable deceleration. Patient states she had a BPP in the office on Wednesday and has an appointment with MFM on Monday.  Orders to discharge patient home.

## 2013-08-08 ENCOUNTER — Encounter (HOSPITAL_COMMUNITY): Payer: Self-pay | Admitting: *Deleted

## 2013-08-08 ENCOUNTER — Encounter (HOSPITAL_COMMUNITY): Payer: BC Managed Care – PPO | Admitting: Anesthesiology

## 2013-08-08 ENCOUNTER — Inpatient Hospital Stay (HOSPITAL_COMMUNITY)
Admission: AD | Admit: 2013-08-08 | Discharge: 2013-08-10 | DRG: 775 | Disposition: A | Discharge status: Home / Self Care | Payer: BC Managed Care – PPO | Source: Ambulatory Visit | Attending: Obstetrics and Gynecology | Admitting: Obstetrics and Gynecology

## 2013-08-08 ENCOUNTER — Inpatient Hospital Stay (HOSPITAL_COMMUNITY): Payer: BC Managed Care – PPO | Admitting: Anesthesiology

## 2013-08-08 DIAGNOSIS — O358XX0 Maternal care for other (suspected) fetal abnormality and damage, not applicable or unspecified: Principal | ICD-10-CM | POA: Diagnosis present

## 2013-08-08 LAB — POCT FERN TEST: POCT FERN TEST: POSITIVE

## 2013-08-08 LAB — CBC
HCT: 33.5 % — ABNORMAL LOW (ref 36.0–46.0)
HEMOGLOBIN: 11.7 g/dL — AB (ref 12.0–15.0)
MCH: 31.6 pg (ref 26.0–34.0)
MCHC: 34.9 g/dL (ref 30.0–36.0)
MCV: 90.5 fL (ref 78.0–100.0)
Platelets: 238 10*3/uL (ref 150–400)
RBC: 3.7 MIL/uL — ABNORMAL LOW (ref 3.87–5.11)
RDW: 12.6 % (ref 11.5–15.5)
WBC: 13.8 10*3/uL — ABNORMAL HIGH (ref 4.0–10.5)

## 2013-08-08 LAB — RPR: RPR Ser Ql: NONREACTIVE

## 2013-08-08 MED ORDER — EPHEDRINE 5 MG/ML INJ
10.0000 mg | INTRAVENOUS | Status: DC | PRN
  Filled 2013-08-08: qty 4
  Filled 2013-08-08: qty 2

## 2013-08-08 MED ORDER — FENTANYL 2.5 MCG/ML BUPIVACAINE 1/10 % EPIDURAL INFUSION (WH - ANES)
INTRAMUSCULAR | Status: DC | PRN
Start: 2013-08-08 — End: 2013-08-09
  Administered 2013-08-08: 14 mL/h via EPIDURAL

## 2013-08-08 MED ORDER — LIDOCAINE HCL (PF) 1 % IJ SOLN
30.0000 mL | INTRAMUSCULAR | Status: DC | PRN
  Filled 2013-08-08: qty 30

## 2013-08-08 MED ORDER — ONDANSETRON HCL 4 MG/2ML IJ SOLN: 4.0000 mg | Freq: Four times a day (QID) | INTRAMUSCULAR | Status: DC | PRN

## 2013-08-08 MED ORDER — IBUPROFEN 600 MG PO TABS: 600.0000 mg | ORAL_TABLET | Freq: Four times a day (QID) | ORAL | Status: DC | PRN

## 2013-08-08 MED ORDER — LACTATED RINGERS IV SOLN
500.0000 mL | Freq: Once | INTRAVENOUS | Status: AC
  Administered 2013-08-08: 500 mL via INTRAVENOUS

## 2013-08-08 MED ORDER — DIPHENHYDRAMINE HCL 50 MG/ML IJ SOLN: 12.5000 mg | INTRAMUSCULAR | Status: DC | PRN

## 2013-08-08 MED ORDER — FLEET ENEMA 7-19 GM/118ML RE ENEM: 1.0000 | ENEMA | RECTAL | Status: DC | PRN

## 2013-08-08 MED ORDER — CITRIC ACID-SODIUM CITRATE 334-500 MG/5ML PO SOLN: 30.0000 mL | ORAL | Status: DC | PRN

## 2013-08-08 MED ORDER — FENTANYL 2.5 MCG/ML BUPIVACAINE 1/10 % EPIDURAL INFUSION (WH - ANES)
14.0000 mL/h | INTRAMUSCULAR | Status: DC | PRN
  Filled 2013-08-08: qty 125

## 2013-08-08 MED ORDER — OXYTOCIN 40 UNITS IN LACTATED RINGERS INFUSION - SIMPLE MED: 62.5000 mL/h | INTRAVENOUS | Status: DC

## 2013-08-08 MED ORDER — EPHEDRINE 5 MG/ML INJ
10.0000 mg | INTRAVENOUS | Status: DC | PRN
  Filled 2013-08-08: qty 2

## 2013-08-08 MED ORDER — TERBUTALINE SULFATE 1 MG/ML IJ SOLN: 0.2500 mg | Freq: Once | INTRAMUSCULAR | Status: AC | PRN

## 2013-08-08 MED ORDER — LACTATED RINGERS IV SOLN: 500.0000 mL | INTRAVENOUS | Status: DC | PRN

## 2013-08-08 MED ORDER — LIDOCAINE HCL (PF) 1 % IJ SOLN
INTRAMUSCULAR | Status: DC | PRN
  Administered 2013-08-08 (×2): 8 mL

## 2013-08-08 MED ORDER — PHENYLEPHRINE 40 MCG/ML (10ML) SYRINGE FOR IV PUSH (FOR BLOOD PRESSURE SUPPORT)
80.0000 ug | PREFILLED_SYRINGE | INTRAVENOUS | Status: DC | PRN
  Filled 2013-08-08: qty 2

## 2013-08-08 MED ORDER — OXYTOCIN BOLUS FROM INFUSION: 500.0000 mL | INTRAVENOUS | Status: DC

## 2013-08-08 MED ORDER — LACTATED RINGERS IV SOLN
INTRAVENOUS | Status: DC
  Administered 2013-08-08 (×2): via INTRAVENOUS

## 2013-08-08 MED ORDER — PHENYLEPHRINE 40 MCG/ML (10ML) SYRINGE FOR IV PUSH (FOR BLOOD PRESSURE SUPPORT)
80.0000 ug | PREFILLED_SYRINGE | INTRAVENOUS | Status: DC | PRN
  Filled 2013-08-08: qty 2
  Filled 2013-08-08: qty 10

## 2013-08-08 MED ORDER — OXYTOCIN 40 UNITS IN LACTATED RINGERS INFUSION - SIMPLE MED
1.0000 m[IU]/min | INTRAVENOUS | Status: DC
Start: 2013-08-08 — End: 2013-08-09
  Administered 2013-08-08: 1 m[IU]/min via INTRAVENOUS
  Filled 2013-08-08: qty 1000

## 2013-08-08 NOTE — MAU Note (Signed)
Patient presents to MAU with leaking of fluid since 4pm today. Denies VB or contractions at this time. Reports good fetal movement

## 2013-08-08 NOTE — H&P (Signed)
Carla Edwards is a 33 y.o. female presenting for SROM.Pregnancy complicated by fetal with non functioning kidney. The parents have  Consulted with Carla Edwards of pediatric urology at [facility] and he has recommended Amoxicillin for the baby to be started after delivery. Maternal Medical History:  Reason for admission: Rupture of membranes and contractions.   Contractions: Frequency: irregular.   Perceived severity is moderate.    Fetal activity: Perceived fetal activity is normal.      OB History   Grav Para Term Preterm Abortions TAB SAB Ect Mult Living   3 1 1  1  1   1      Past Medical History  Diagnosis Date  . Missed ab   . PONV (postoperative nausea and vomiting)     slow to awaken  . Headache(784.0)   . Anxiety    Past Surgical History  Procedure Laterality Date  . Knee surgery  1993  . Wisdom tooth extraction  98  . Dilation and evacuation N/A 09/13/2012    Procedure: DILATATION AND EVACUATION;  Surgeon: Carla Pearson, MD;  Location: [facility];  Service: Gynecology;  Laterality: N/A;   Family History: family history is not on file. Social History:  reports that she has never smoked. She does not have any smokeless tobacco history on file. She reports that she does not drink alcohol or use illicit drugs.   Prenatal Transfer Tool  Maternal Diabetes: No Genetic Screening: Normal Maternal Ultrasounds/Referrals: Abnormal:  Findings:   Fetal Kidney Anomalies Fetal Ultrasounds or other Referrals:  Referred to Materal Fetal Medicine  Maternal Substance Abuse:  No Significant Maternal Medications:  None Significant Maternal Lab Results:  None Other Comments:  None  Review of Systems  All other systems reviewed and are negative.    Dilation: 3 Effacement (%): 90 Station: -2 Exam by:: CarlaGrewel Blood pressure 138/87, pulse 93, temperature 98.3 F (36.8 C), temperature source Oral, resp. rate 18, height 5\' 4"  (1.626 m), weight 68.493 kg (151 lb), last menstrual  period 11/16/2012, SpO2 100.00%. Maternal Exam:  Uterine Assessment: Contraction strength is moderate.  Contraction frequency is regular.   Abdomen: Fetal presentation: vertex     Fetal Exam Fetal State Assessment: Category I - tracings are normal.     Physical Exam  Nursing note and vitals reviewed. Constitutional: She appears well-developed.  HENT:  Head: Normocephalic.  Eyes: Pupils are equal, round, and reactive to light.  Neck: Normal range of motion.  Cardiovascular: Normal rate.   Respiratory: Effort normal.  GI: Soft.    Prenatal labs: ABO, Rh: O/Positive/-- (08/11 0000) Antibody: Negative (08/11 0000) Rubella: Immune (08/11 0000) RPR: Nonreactive (08/11 0000)  HBsAg: Negative (08/11 0000)  HIV: Non-reactive (08/11 0000)  GBS: Negative (02/13 0000)   Assessment/Plan: IUP at term  SROM Non Functioning Fetal kidney Admit Possible Pitocin Epidural prn maternal request Alerted peds of fetal issues   Carla Edwards 08/08/2013, 6:24 PM

## 2013-08-08 NOTE — Anesthesia Preprocedure Evaluation (Signed)
Anesthesia Evaluation  Patient identified by MRN, date of birth, ID band Patient awake    Reviewed: Allergy & Precautions, H&P , NPO status , Patient's Chart, lab work & pertinent test results  Airway Mallampati: I TM Distance: >3 FB Neck ROM: full    Dental no notable dental hx.    Pulmonary neg pulmonary ROS,    Pulmonary exam normal       Cardiovascular negative cardio ROS      Neuro/Psych    GI/Hepatic negative GI ROS, Neg liver ROS,   Endo/Other  negative endocrine ROS  Renal/GU negative Renal ROS     Musculoskeletal   Abdominal Normal abdominal exam  (+)   Peds  Hematology negative hematology ROS (+)   Anesthesia Other Findings   Reproductive/Obstetrics (+) Pregnancy                           Anesthesia Physical Anesthesia Plan  ASA: II  Anesthesia Plan: Epidural   Post-op Pain Management:    Induction:   Airway Management Planned:   Additional Equipment:   Intra-op Plan:   Post-operative Plan:   Informed Consent: I have reviewed the patients History and Physical, chart, labs and discussed the procedure including the risks, benefits and alternatives for the proposed anesthesia with the patient or authorized representative who has indicated his/her understanding and acceptance.     Plan Discussed with:   Anesthesia Plan Comments:         Anesthesia Quick Evaluation

## 2013-08-08 NOTE — Anesthesia Procedure Notes (Signed)
Epidural Patient location during procedure: OB Start time: 08/08/2013 9:32 PM End time: 08/08/2013 9:36 PM  Staffing Anesthesiologist: Lyn Hollingshead Performed by: anesthesiologist   Preanesthetic Checklist Completed: patient identified, surgical consent, pre-op evaluation, timeout performed, IV checked, risks and benefits discussed and monitors and equipment checked  Epidural Patient position: sitting Prep: site prepped and draped and DuraPrep Patient monitoring: continuous pulse ox and blood pressure Approach: midline Location: L3-L4 Injection technique: LOR air  Needle:  Needle type: Tuohy  Needle gauge: 17 G Needle length: 9 cm and 9 Needle insertion depth: 5 cm cm Catheter type: closed end flexible Catheter size: 19 Gauge Catheter at skin depth: 10 cm Test dose: negative and Other  Assessment Sensory level: T9 Events: blood not aspirated, injection not painful, no injection resistance, negative IV test and no paresthesia  Additional Notes Reason for block:procedure for pain

## 2013-08-09 LAB — CBC
HCT: 32 % — ABNORMAL LOW (ref 36.0–46.0)
Hemoglobin: 11.1 g/dL — ABNORMAL LOW (ref 12.0–15.0)
MCH: 31.5 pg (ref 26.0–34.0)
MCHC: 34.7 g/dL (ref 30.0–36.0)
MCV: 90.9 fL (ref 78.0–100.0)
PLATELETS: 202 10*3/uL (ref 150–400)
RBC: 3.52 MIL/uL — AB (ref 3.87–5.11)
RDW: 12.8 % (ref 11.5–15.5)
WBC: 19.3 10*3/uL — AB (ref 4.0–10.5)

## 2013-08-09 MED ORDER — MEDROXYPROGESTERONE ACETATE 150 MG/ML IM SUSP: 150.0000 mg | INTRAMUSCULAR | Status: DC | PRN

## 2013-08-09 MED ORDER — PRENATAL MULTIVITAMIN CH
1.0000 | ORAL_TABLET | Freq: Every day | ORAL | Status: DC
  Administered 2013-08-09: 1 via ORAL
  Filled 2013-08-09: qty 1

## 2013-08-09 MED ORDER — TETANUS-DIPHTH-ACELL PERTUSSIS 5-2.5-18.5 LF-MCG/0.5 IM SUSP: 0.5000 mL | Freq: Once | INTRAMUSCULAR | Status: DC

## 2013-08-09 MED ORDER — LANOLIN HYDROUS EX OINT: TOPICAL_OINTMENT | CUTANEOUS | Status: DC | PRN

## 2013-08-09 MED ORDER — BISACODYL 10 MG RE SUPP: 10.0000 mg | Freq: Every day | RECTAL | Status: DC | PRN

## 2013-08-09 MED ORDER — ONDANSETRON HCL 4 MG/2ML IJ SOLN: 4.0000 mg | INTRAMUSCULAR | Status: DC | PRN

## 2013-08-09 MED ORDER — DIBUCAINE 1 % RE OINT: 1.0000 "application " | TOPICAL_OINTMENT | RECTAL | Status: DC | PRN

## 2013-08-09 MED ORDER — OXYCODONE-ACETAMINOPHEN 5-325 MG PO TABS: 1.0000 | ORAL_TABLET | ORAL | Status: DC | PRN

## 2013-08-09 MED ORDER — MEASLES, MUMPS & RUBELLA VAC ~~LOC~~ INJ
0.5000 mL | INJECTION | Freq: Once | SUBCUTANEOUS | Status: DC
  Filled 2013-08-09: qty 0.5

## 2013-08-09 MED ORDER — ONDANSETRON HCL 4 MG PO TABS: 4.0000 mg | ORAL_TABLET | ORAL | Status: DC | PRN

## 2013-08-09 MED ORDER — SENNOSIDES-DOCUSATE SODIUM 8.6-50 MG PO TABS: 2.0000 | ORAL_TABLET | ORAL | Status: DC

## 2013-08-09 MED ORDER — ZOLPIDEM TARTRATE 5 MG PO TABS: 5.0000 mg | ORAL_TABLET | Freq: Every evening | ORAL | Status: DC | PRN

## 2013-08-09 MED ORDER — WITCH HAZEL-GLYCERIN EX PADS
1.0000 "application " | MEDICATED_PAD | CUTANEOUS | Status: DC | PRN
  Administered 2013-08-09: 1 via TOPICAL

## 2013-08-09 MED ORDER — SIMETHICONE 80 MG PO CHEW
80.0000 mg | CHEWABLE_TABLET | ORAL | Status: DC | PRN
Start: 2013-08-09 — End: 2013-08-10

## 2013-08-09 MED ORDER — FLEET ENEMA 7-19 GM/118ML RE ENEM
1.0000 | ENEMA | Freq: Every day | RECTAL | Status: DC | PRN
Start: 2013-08-09 — End: 2013-08-10

## 2013-08-09 MED ORDER — BENZOCAINE-MENTHOL 20-0.5 % EX AERO
1.0000 "application " | INHALATION_SPRAY | CUTANEOUS | Status: DC | PRN
  Administered 2013-08-09: 1 via TOPICAL
  Filled 2013-08-09: qty 56

## 2013-08-09 MED ORDER — DIPHENHYDRAMINE HCL 25 MG PO CAPS: 25.0000 mg | ORAL_CAPSULE | Freq: Four times a day (QID) | ORAL | Status: DC | PRN

## 2013-08-09 MED ORDER — IBUPROFEN 600 MG PO TABS
600.0000 mg | ORAL_TABLET | Freq: Four times a day (QID) | ORAL | Status: DC
  Administered 2013-08-09 – 2013-08-10 (×5): 600 mg via ORAL
  Filled 2013-08-09 (×5): qty 1

## 2013-08-09 NOTE — Anesthesia Postprocedure Evaluation (Signed)
Anesthesia Post Note  Patient: Carla Edwards  Procedure(s) Performed: * No procedures listed *  Anesthesia type: Epidural  Patient location: Mother/Baby  Post pain: Pain level controlled  Post assessment: Post-op Vital signs reviewed  Last Vitals:  Filed Vitals:   08/09/13 0800  BP:   Pulse:   Temp: 36.7 C  Resp:     Post vital signs: Reviewed  Level of consciousness:alert  Complications: No apparent anesthesia complications

## 2013-08-09 NOTE — Progress Notes (Signed)
Patient counseled for circ including risk of bleeding, infection, and scarring.  All questions were answered and the patient wishes to proceed.   Linda Hedges, DO

## 2013-08-09 NOTE — Progress Notes (Signed)
Post Partum Day 1 Subjective: no complaints, up ad lib, voiding and tolerating PO  Objective: Blood pressure 127/73, pulse 89, temperature 100.6 F (38.1 C), temperature source Oral, resp. rate 18, height 5\' 4"  (1.626 m), weight 151 lb (68.493 kg), last menstrual period 11/16/2012, SpO2 96.00%, unknown if currently breastfeeding.  Physical Exam:  General: alert and cooperative Lochia: appropriate Uterine Fundus: firm Incision: perineum intact DVT Evaluation: No evidence of DVT seen on physical exam. Negative Homan's sign. No cords or calf tenderness. No significant calf/ankle edema.   Recent Labs  08/08/13 1730 08/09/13 0605  HGB 11.7* 11.1*  HCT 33.5* 32.0*    Assessment/Plan: Plan for discharge tomorrow and Circumcision prior to discharge Recheck temp- 98.0  LOS: 1 day   CURTIS,CAROL G 08/09/2013, 8:33 AM

## 2013-08-10 NOTE — Lactation Note (Addendum)
This note was copied from the chart of [place]. Lactation Consultation Note: Follow up visit with mom before DC. Experienced BF mom reports that baby is latching well and she feels her breasts are getting fuller this morning. LS 10 by RN. No questions at present. To call prn  Patient Name: Carla Edwards [id number] Date: 08/10/2013 Reason for consult: Follow-up assessment   Maternal Data Does the patient have breastfeeding experience prior to this delivery?: Yes  Feeding Feeding Type: Breast Fed  LATCH Score/Interventions          Comfort (Breast/Nipple): Filling, red/small blisters or bruises, mild/mod discomfort  Problem noted: Filling        Lactation Tools Discussed/Used     Consult Status Consult Status: Complete    Truddie Crumble 08/10/2013, 8:24 AM

## 2013-08-10 NOTE — Discharge Summary (Signed)
Obstetric Discharge Summary Reason for Admission: rupture of membranes Prenatal Procedures: ultrasound Intrapartum Procedures: spontaneous vaginal delivery Postpartum Procedures: none Complications-Operative and Postpartum: none Hemoglobin  Date Value Ref Range Status  08/09/2013 11.1* 12.0 - 15.0 Edwards/dL Final     HCT  Date Value Ref Range Status  08/09/2013 32.0* 36.0 - 46.0 % Final    Physical Exam:  General: alert and cooperative Lochia: appropriate Uterine Fundus: firm Incision: perineum intact DVT Evaluation: No evidence of DVT seen on physical exam. Negative Homan's sign. No cords or calf tenderness. No significant calf/ankle edema.  Discharge Diagnoses: Term Pregnancy-delivered  Discharge Information: Date: 08/10/2013 Activity: pelvic rest Diet: routine Medications: PNV and Ibuprofen Condition: stable Instructions: refer to practice specific booklet Discharge to: home   Newborn Data: Live born female  Birth Weight: 7 lb 3.9 oz ([id number] Edwards) APGAR: 7, 8  Home with mother.  Carla Edwards 08/10/2013, 8:43 AM

## 2013-08-18 ENCOUNTER — Ambulatory Visit (HOSPITAL_COMMUNITY)
Admission: RE | Admit: 2013-08-18 | Discharge: 2013-08-18 | Disposition: A | Discharge status: Home / Self Care | Payer: BC Managed Care – PPO | Source: Ambulatory Visit | Attending: Obstetrics and Gynecology | Admitting: Obstetrics and Gynecology

## 2013-08-18 NOTE — Lactation Note (Signed)
Adult Lactation Consultation Outpatient Visit Note  Patient Name: Carla Edwards         BABY: [place] Date of Birth: [birth year]                      DOB: [birth year] Gestational Age at Delivery: [redacted]w[redacted]d     BIRTH WEIGHT 7-3.9 Type of Delivery: NVD                          DISCHARGE WEIGHT: 7-0                                                               WEIGHT TODAY: 6-10.6 Breastfeeding History: Frequency of Breastfeeding: EVERY 2-2 1/2 HOURS Length of Feeding: 8 MINUTES Voids: QS Stools: 4-6 YELLOW/24 HOURS  Supplementing / Method:NONE Pumping:  Type of Pump:MEDELA PUMP IN STYLE   Frequency:TWICE  Volume:  4-5 OZ.  Comments:    Consultation Evaluation: Mom and 10 day infant here for feeding assessment.  Baby remains below discharge weight(7%) and has not gained in past week.  Mom has full breasts and very abundant milk  supply.  Baby is feeding every 2-3 hours but short durations.  Observed baby latch easily and well.  Baby nursed actively with minimal stimulation and alternate breast massage.  Baby came off very relaxed and content and transferred 100 mls.  Discussed with mom that his short feeds are most likely cause of slow weight gain.  Mom will use more waking techniques and breast massage to increase milk flow and intake.  Hopefully this will also lengthen feedings and add additional calories for growth.  Recommended smart start visit for weight check in 2-3 days and to call [facility] office for need for follow up.  Initial Feeding Assessment: 25 MINUTE FEEDING Pre-feed Weight:3022 Post-feed Weight:3122 Amount Transferred:100 MLS Comments:  Additional Feeding Assessment: Pre-feed Weight: Post-feed Weight: Amount Transferred: Comments:  Additional Feeding Assessment: Pre-feed Weight: Post-feed Weight: Amount Transferred: Comments:  Total Breast milk Transferred this Visit: 100 MLS Total Supplement Given: NONE  Additional Interventions:   Follow-Up SMART START 2-3  DAYS      [name] 08/18/2013, 3:40 PM

## 2013-09-14 ENCOUNTER — Other Ambulatory Visit: Payer: Self-pay | Admitting: Dermatology

## 2014-04-09 ENCOUNTER — Encounter (HOSPITAL_COMMUNITY): Payer: Self-pay | Admitting: *Deleted

## 2014-11-12 ENCOUNTER — Other Ambulatory Visit: Payer: Self-pay | Admitting: Obstetrics and Gynecology

## 2014-11-13 LAB — CYTOLOGY - PAP

## 2015-12-24 ENCOUNTER — Inpatient Hospital Stay (HOSPITAL_COMMUNITY)
Admission: AD | Admit: 2015-12-24 | Discharge: 2015-12-24 | Disposition: A | Discharge status: Home / Self Care | Payer: BLUE CROSS/BLUE SHIELD | Source: Ambulatory Visit | Attending: Obstetrics and Gynecology | Admitting: Obstetrics and Gynecology

## 2015-12-24 ENCOUNTER — Encounter (HOSPITAL_COMMUNITY): Payer: Self-pay | Admitting: *Deleted

## 2015-12-24 DIAGNOSIS — O99341 Other mental disorders complicating pregnancy, first trimester: Secondary | ICD-10-CM | POA: Diagnosis not present

## 2015-12-24 DIAGNOSIS — O99281 Endocrine, nutritional and metabolic diseases complicating pregnancy, first trimester: Secondary | ICD-10-CM | POA: Diagnosis not present

## 2015-12-24 DIAGNOSIS — O26891 Other specified pregnancy related conditions, first trimester: Secondary | ICD-10-CM | POA: Diagnosis not present

## 2015-12-24 DIAGNOSIS — R109 Unspecified abdominal pain: Secondary | ICD-10-CM | POA: Diagnosis present

## 2015-12-24 DIAGNOSIS — Z79899 Other long term (current) drug therapy: Secondary | ICD-10-CM | POA: Insufficient documentation

## 2015-12-24 DIAGNOSIS — Z9889 Other specified postprocedural states: Secondary | ICD-10-CM | POA: Diagnosis not present

## 2015-12-24 DIAGNOSIS — Z3A01 Less than 8 weeks gestation of pregnancy: Secondary | ICD-10-CM | POA: Diagnosis not present

## 2015-12-24 DIAGNOSIS — O219 Vomiting of pregnancy, unspecified: Secondary | ICD-10-CM | POA: Insufficient documentation

## 2015-12-24 DIAGNOSIS — E860 Dehydration: Secondary | ICD-10-CM

## 2015-12-24 LAB — URINALYSIS, ROUTINE W REFLEX MICROSCOPIC
Bilirubin Urine: NEGATIVE
Glucose, UA: NEGATIVE mg/dL
Hgb urine dipstick: NEGATIVE
Ketones, ur: NEGATIVE mg/dL
NITRITE: NEGATIVE
Protein, ur: NEGATIVE mg/dL
SPECIFIC GRAVITY, URINE: 1.02 (ref 1.005–1.030)
pH: 6 (ref 5.0–8.0)

## 2015-12-24 LAB — POCT PREGNANCY, URINE: Preg Test, Ur: POSITIVE — AB

## 2015-12-24 LAB — URINE MICROSCOPIC-ADD ON: RBC / HPF: NONE SEEN RBC/hpf (ref 0–5)

## 2015-12-24 MED ORDER — DEXTROSE 5 % IN LACTATED RINGERS IV BOLUS
1000.0000 mL | Freq: Once | INTRAVENOUS | Status: AC
  Administered 2015-12-24: 1000 mL via INTRAVENOUS

## 2015-12-24 MED ORDER — LACTATED RINGERS IV BOLUS (SEPSIS)
1000.0000 mL | Freq: Once | INTRAVENOUS | Status: AC
  Administered 2015-12-24: 1000 mL via INTRAVENOUS

## 2015-12-24 MED ORDER — SODIUM CHLORIDE 0.9 % IV SOLN
25.0000 mg | Freq: Once | INTRAVENOUS | Status: AC
  Administered 2015-12-24: 25 mg via INTRAVENOUS
  Filled 2015-12-24: qty 1

## 2015-12-24 NOTE — Discharge Instructions (Signed)
Dehydration, Adult Dehydration is a condition in which you do not have enough fluid or water in your body. It happens when you take in less fluid than you lose. Vital organs such as the kidneys, brain, and heart cannot function without a proper amount of fluids. Any loss of fluids from the body can cause dehydration.  Dehydration can range from mild to severe. This condition should be treated right away to help prevent it from becoming severe. CAUSES  This condition may be caused by:  Vomiting.  Diarrhea.  Excessive sweating, such as when exercising in hot or humid weather.  Not drinking enough fluid during strenuous exercise or during an illness.  Excessive urine output.  Fever.  Certain medicines. RISK FACTORS This condition is more likely to develop in:  People who are taking certain medicines that cause the body to lose excess fluid (diuretics).   People who have a chronic illness, such as diabetes, that may increase urination.  Older adults.   People who live at high altitudes.   People who participate in endurance sports.  SYMPTOMS  Mild Dehydration  Thirst.  Dry lips.  Slightly dry mouth.  Dry, warm skin. Moderate Dehydration  Very dry mouth.   Muscle cramps.   Dark urine and decreased urine production.   Decreased tear production.   Headache.   Light-headedness, especially when you stand up from a sitting position.  Severe Dehydration  Changes in skin.   Cold and clammy skin.   Skin does not spring back quickly when lightly pinched and released.   Changes in body fluids.   Extreme thirst.   No tears.   Not able to sweat when body temperature is high, such as in hot weather.   Minimal urine production.   Changes in vital signs.   Rapid, weak pulse (more than 100 beats per minute when you are sitting still).   Rapid breathing.   Low blood pressure.   Other changes.   Sunken eyes.   Cold hands and feet.    Confusion.  Lethargy and difficulty being awakened.  Fainting (syncope).   Short-term weight loss.   Unconsciousness. DIAGNOSIS  This condition may be diagnosed based on your symptoms. You may also have tests to determine how severe your dehydration is. These tests may include:   Urine tests.   Blood tests.  TREATMENT  Treatment for this condition depends on the severity. Mild or moderate dehydration can often be treated at home. Treatment should be started right away. Do not wait until dehydration becomes severe. Severe dehydration needs to be treated at the hospital. Treatment for Mild Dehydration  Drinking plenty of water to replace the fluid you have lost.   Replacing minerals in your blood (electrolytes) that you may have lost.  Treatment for Moderate Dehydration  Consuming oral rehydration solution (ORS). Treatment for Severe Dehydration  Receiving fluid through an IV tube.   Receiving electrolyte solution through a feeding tube that is passed through your nose and into your stomach (nasogastric tube or NG tube).  Correcting any abnormalities in electrolytes. HOME CARE INSTRUCTIONS   Drink enough fluid to keep your urine clear or pale yellow.   Drink water or fluid slowly by taking small sips. You can also try sucking on ice cubes.  Have food or beverages that contain electrolytes. Examples include bananas and sports drinks.  Take over-the-counter and prescription medicines only as told by your health care provider.   Prepare ORS according to the manufacturer's instructions. Take sips  of ORS every 5 minutes until your urine returns to normal.  If you have vomiting or diarrhea, continue to try to drink water, ORS, or both.   If you have diarrhea, avoid:   Beverages that contain caffeine.   Fruit juice.   Milk.   Carbonated soft drinks.  Do not take salt tablets. This can lead to the condition of having too much sodium in your body  (hypernatremia).  SEEK MEDICAL CARE IF:  You cannot eat or drink without vomiting.  You have had moderate diarrhea during a period of more than 24 hours.  You have a fever. SEEK IMMEDIATE MEDICAL CARE IF:   You have extreme thirst.  You have severe diarrhea.  You have not urinated in 6-8 hours, or you have urinated only a small amount of very dark urine.  You have shriveled skin.  You are dizzy, confused, or both.   This information is not intended to replace advice given to you by your health care provider. Make sure you discuss any questions you have with your health care provider.   Document Released: 05/25/2005 Document Revised: 02/13/2015 Document Reviewed: 10/10/2014 Elsevier Interactive Patient Education 2016 Elsevier Inc[employer] Morning Sickness Morning sickness is when you feel sick to your stomach (nauseous) during pregnancy. This nauseous feeling may or may not come with vomiting. It often occurs in the morning but can be a problem any time of day. Morning sickness is most common during the first trimester, but it may continue throughout pregnancy. While morning sickness is unpleasant, it is usually harmless unless you develop severe and continual vomiting (hyperemesis gravidarum). This condition requires more intense treatment.  CAUSES  The cause of morning sickness is not completely known but seems to be related to normal hormonal changes that occur in pregnancy. RISK FACTORS You are at greater risk if you:  Experienced nausea or vomiting before your pregnancy.  Had morning sickness during a previous pregnancy.  Are pregnant with more than one baby, such as twins. TREATMENT  Do not use any medicines (prescription, over-the-counter, or herbal) for morning sickness without first talking to your health care provider. Your health care provider may prescribe or recommend:  Vitamin B6 supplements.  Anti-nausea medicines.  The herbal medicine ginger. HOME CARE  INSTRUCTIONS   Only take over-the-counter or prescription medicines as directed by your health care provider.  Taking multivitamins before getting pregnant can prevent or decrease the severity of morning sickness in most women.  Eat a piece of dry toast or unsalted crackers before getting out of bed in the morning.  Eat five or six small meals a day.  Eat dry and bland foods (rice, baked potato). Foods high in carbohydrates are often helpful.  Do not drink liquids with your meals. Drink liquids between meals.  Avoid greasy, fatty, and spicy foods.  Get someone to cook for you if the smell of any food causes nausea and vomiting.  If you feel nauseous after taking prenatal vitamins, take the vitamins at night or with a snack.  Snack on protein foods (nuts, yogurt, cheese) between meals if you are hungry.  Eat unsweetened gelatins for desserts.  Wearing an acupressure wristband (worn for sea sickness) may be helpful.  Acupuncture may be helpful.  Do not smoke.  Get a humidifier to keep the air in your house free of odors.  Get plenty of fresh air. SEEK MEDICAL CARE IF:   Your home remedies are not working, and you need medicine.  You feel dizzy  or lightheaded.  You are losing weight. SEEK IMMEDIATE MEDICAL CARE IF:   You have persistent and uncontrolled nausea and vomiting.  You pass out (faint). MAKE SURE YOU:  Understand these instructions.  Will watch your condition.  Will get help right away if you are not doing well or get worse.   This information is not intended to replace advice given to you by your health care provider. Make sure you discuss any questions you have with your health care provider.   Document Released: 07/16/2006 Document Revised: 05/30/2013 Document Reviewed: 11/09/2012 Elsevier Interactive Patient Education [employer].

## 2015-12-24 NOTE — MAU Provider Note (Signed)
Chief Complaint: Emesis   First Provider Initiated Contact with Patient 12/24/15 Carla Edwards is a 35 y.o. [id number] at [redacted]w[redacted]d by LMP who presents to maternity admissions reporting vomiting for 5 days.  Had some abdominal cramping last night, but none today. Thinks it is from the vomiting overusing her muscles.  No fever or diarrhea.   She denies vaginal bleeding, vaginal itching/burning, urinary symptoms, h/a, dizziness, or fever/chills.     Emesis  This is a new problem. The current episode started in the past 7 days. The problem occurs 5 to 10 times per day. The problem has been unchanged. There has been no fever. Pertinent negatives include no abdominal pain, chills, diarrhea, dizziness, fever or myalgias. She has tried nothing for the symptoms.   RN Note: Pt C/O vomiting for 5 days, can't keep fluids down. Had abd pain last night, none today. Denies bleeding.  Past Medical History  Diagnosis Date  . Missed ab   . PONV (postoperative nausea and vomiting)     slow to awaken  . Headache(784.0)   . Anxiety    Past Surgical History  Procedure Laterality Date  . Knee surgery  1993  . Wisdom tooth extraction  98  . Dilation and evacuation N/A 09/13/2012    Procedure: DILATATION AND EVACUATION;  Surgeon: Marylynn Pearson, MD;  Location: [facility];  Service: Gynecology;  Laterality: N/A;   Social History   Social History  . Marital Status: Married    Spouse Name: N/A  . Number of Children: N/A  . Years of Education: N/A   Occupational History  . Not on file.   Social History Main Topics  . Smoking status: Never Smoker   . Smokeless tobacco: Not on file  . Alcohol Use: No  . Drug Use: No  . Sexual Activity: Yes    Birth Control/ Protection: None   Other Topics Concern  . Not on file   Social History Narrative   No current facility-administered medications on file prior to encounter.   Current Outpatient Prescriptions on File Prior to Encounter   Medication Sig Dispense Refill  . Prenatal Vit-Fe Fumarate-FA (PRENATAL MULTIVITAMIN) TABS Take 1 tablet by mouth daily at 12 noon.     Allergies  Allergen Reactions  . Sulfa Antibiotics Anaphylaxis    Tongue swelling  . Lorcet 10-650 [Hydrocodone-Acetaminophen]     Does not remember-as child    I have reviewed patient's Past Medical Hx, Surgical Hx, Family Hx, Social Hx, medications and allergies.   ROS:  Review of Systems  Constitutional: Negative for fever and chills.  Gastrointestinal: Positive for vomiting. Negative for abdominal pain and diarrhea.  Musculoskeletal: Negative for myalgias.  Neurological: Negative for dizziness.    Physical Exam  Patient Vitals for the past 24 hrs:  BP Temp Temp src Pulse Resp Height  12/24/15 1213 132/89 mmHg 98 F (36.7 C) Oral 77 16 5\' 4"  (1.626 m)   Physical Exam  Constitutional: Well-developed, female in no acute distress.  Cardiovascular: normal rate Respiratory: normal effort GI: Abd soft, non-tender. Pos BS x 4 MS: Extremities nontender, no edema, normal ROM Neurologic: Alert and oriented x 4.  GU: Neg CVAT.   LAB RESULTS Results for orders placed or performed during the hospital encounter of 12/24/15 (from the past 24 hour(s))  Urinalysis, Routine w reflex microscopic (not at [facility])     Status: Abnormal   Collection Time: 12/24/15 12:17 PM  Result  Value Ref Range   Color, Urine YELLOW YELLOW   APPearance HAZY (A) CLEAR   Specific Gravity, Urine 1.020 1.005 - 1.030   pH 6.0 5.0 - 8.0   Glucose, UA NEGATIVE NEGATIVE mg/dL   Hgb urine dipstick NEGATIVE NEGATIVE   Bilirubin Urine NEGATIVE NEGATIVE   Ketones, ur NEGATIVE NEGATIVE mg/dL   Protein, ur NEGATIVE NEGATIVE mg/dL   Nitrite NEGATIVE NEGATIVE   Leukocytes, UA MODERATE (A) NEGATIVE  Urine microscopic-add on     Status: Abnormal   Collection Time: 12/24/15 12:17 PM  Result Value Ref Range   Squamous Epithelial / LPF 0-5 (A) NONE SEEN   WBC, UA 0-5 0 - 5 WBC/hpf    RBC / HPF NONE SEEN 0 - 5 RBC/hpf   Bacteria, UA RARE (A) NONE SEEN   Urine-Other MUCOUS PRESENT   Pregnancy, urine POC     Status: Abnormal   Collection Time: 12/24/15 12:30 PM  Result Value Ref Range   Preg Test, Ur POSITIVE (A) NEGATIVE       IMAGING Bedside US done which confirmed + fetal heart rate  MAU Management/MDM: UA ordered to assess for dehydration IV ordered Phenergan infusion ordered Then gave a second bag of fluids After that she was feeling better and able to tolerate gingerale  ASSESSMENT SIUP at [redacted]w[redacted]d Nausea and vomiting of pregnancy Dehydration  PLAN Discharge home Advance diet as tolerated Has Rx already for Diclegis Follow up in office for prenatal care    Medication List    ASK your doctor about these medications        prenatal multivitamin Tabs tablet  Take 1 tablet by mouth daily at 12 noon.        Pt stable at time of discharge. Encouraged to return here or to other Urgent Care/ED if she develops worsening of symptoms, increase in pain, fever, or other concerning symptoms.    Hansel Feinstein CNM, MSN Certified Nurse-Midwife 12/24/2015  1:51 PM

## 2015-12-24 NOTE — MAU Note (Signed)
Pt C/O vomiting for 5 days, can't keep fluids down.  Had abd pain last night, none today.  Denies bleeding.

## 2015-12-31 ENCOUNTER — Inpatient Hospital Stay (HOSPITAL_COMMUNITY)
Admission: AD | Admit: 2015-12-31 | Discharge: 2015-12-31 | Disposition: A | Discharge status: Home / Self Care | Payer: BLUE CROSS/BLUE SHIELD | Source: Ambulatory Visit | Attending: Obstetrics and Gynecology | Admitting: Obstetrics and Gynecology

## 2015-12-31 ENCOUNTER — Encounter (HOSPITAL_COMMUNITY): Payer: Self-pay | Admitting: *Deleted

## 2015-12-31 DIAGNOSIS — Z3A01 Less than 8 weeks gestation of pregnancy: Secondary | ICD-10-CM | POA: Insufficient documentation

## 2015-12-31 DIAGNOSIS — E860 Dehydration: Secondary | ICD-10-CM

## 2015-12-31 DIAGNOSIS — Z885 Allergy status to narcotic agent status: Secondary | ICD-10-CM | POA: Insufficient documentation

## 2015-12-31 DIAGNOSIS — Z882 Allergy status to sulfonamides status: Secondary | ICD-10-CM | POA: Insufficient documentation

## 2015-12-31 DIAGNOSIS — O210 Mild hyperemesis gravidarum: Secondary | ICD-10-CM | POA: Insufficient documentation

## 2015-12-31 LAB — URINALYSIS, ROUTINE W REFLEX MICROSCOPIC
Bilirubin Urine: NEGATIVE
Glucose, UA: NEGATIVE mg/dL
Hgb urine dipstick: NEGATIVE
Ketones, ur: 15 mg/dL — AB
Leukocytes, UA: NEGATIVE
NITRITE: NEGATIVE
PROTEIN: NEGATIVE mg/dL
SPECIFIC GRAVITY, URINE: 1.025 (ref 1.005–1.030)
pH: 5.5 (ref 5.0–8.0)

## 2015-12-31 MED ORDER — M.V.I. ADULT IV INJ
INJECTION | Freq: Once | INTRAVENOUS | Status: AC
  Administered 2015-12-31: 16:00:00 via INTRAVENOUS
  Filled 2015-12-31: qty 10

## 2015-12-31 MED ORDER — SODIUM CHLORIDE 0.9 % IV SOLN
25.0000 mg | Freq: Once | INTRAVENOUS | Status: AC
  Administered 2015-12-31: 25 mg via INTRAVENOUS
  Filled 2015-12-31: qty 1

## 2015-12-31 MED ORDER — PROMETHAZINE HCL 25 MG PO TABS: 25.0000 mg | ORAL_TABLET | Freq: Four times a day (QID) | ORAL | 2 refills | Status: DC | PRN

## 2015-12-31 NOTE — Discharge Instructions (Signed)
Hyperemesis Gravidarum Hyperemesis gravidarum is a severe form of nausea and vomiting that happens during pregnancy. Hyperemesis is worse than morning sickness. It may cause you to have nausea or vomiting all day for many days. It may keep you from eating and drinking enough food and liquids. Hyperemesis usually occurs during the first half (the first 20 weeks) of pregnancy. It often goes away once a woman is in her second half of pregnancy. However, sometimes hyperemesis continues through an entire pregnancy.  CAUSES Eating Plan for Hyperemesis Gravidarum Severe cases of hyperemesis gravidarum can lead to dehydration and malnutrition. The hyperemesis eating plan is one way to lessen the symptoms of nausea and vomiting. It is often used with prescribed medicines to control your symptoms.  WHAT CAN I DO TO RELIEVE MY SYMPTOMS? Listen to your body. Everyone is different and has different preferences. Find what works best for you. Some of the following things may help:  Eat and drink slowly.  Eat 5-6 small meals daily instead of 3 large meals.   Eat crackers before you get out of bed in the morning.   Starchy foods are usually well tolerated (such as cereal, toast, bread, potatoes, pasta, rice, and pretzels).   Ginger may help with nausea. Add  tsp ground ginger to hot tea or choose ginger tea.   Try drinking 100% fruit juice or an electrolyte drink.  Continue to take your prenatal vitamins as directed by your health care provider. If you are having trouble taking your prenatal vitamins, talk with your health care provider about different options.  Include at least 1 serving of protein with your meals and snacks (such as meats or poultry, beans, nuts, eggs, or yogurt). Try eating a protein-rich snack before bed (such as cheese and crackers or a half [place] or peanut butter sandwich). WHAT THINGS SHOULD I AVOID TO REDUCE MY SYMPTOMS? The following things may help reduce your  symptoms:  Avoid foods with strong smells. Try eating meals in well-ventilated areas that are free of odors.  Avoid drinking water or other beverages with meals. Try not to drink anything less than 30 minutes before and after meals.  Avoid drinking more than 1 cup of fluid at a time.  Avoid fried or high-fat foods, such as butter and cream sauces.  Avoid spicy foods.  Avoid skipping meals the best you can. Nausea can be more intense on an empty stomach. If you cannot tolerate food at that time, do not force it. Try sucking on ice chips or other frozen items and make up the calories later.  Avoid lying down within 2 hours after eating.   This information is not intended to replace advice given to you by your health care provider. Make sure you discuss any questions you have with your health care provider.   Document Released: 03/22/2007 Document Revised: 05/30/2013 Document Reviewed: 03/29/2013 Elsevier Interactive Patient Education [employer].  The cause of this condition is not completely known but is thought to be related to changes in the body's hormones when pregnant. It could be from the high level of the pregnancy hormone or an increase in estrogen in the body.  SIGNS AND SYMPTOMS   Severe nausea and vomiting.  Nausea that does not go away.  Vomiting that does not allow you to keep any food down.  Weight loss and body fluid loss (dehydration).  Having no desire to eat or not liking food you have previously enjoyed. DIAGNOSIS  Your health care provider will  do a physical exam and ask you about your symptoms. He or she may also order blood tests and urine tests to make sure something else is not causing the problem.  TREATMENT  You may only need medicine to control the problem. If medicines do not control the nausea and vomiting, you will be treated in the hospital to prevent dehydration, increased acid in the blood (acidosis), weight loss, and changes in the  electrolytes in your body that may harm the unborn baby (fetus). You may need IV fluids.  HOME CARE INSTRUCTIONS   Only take over-the-counter or prescription medicines as directed by your health care provider.  Try eating a couple of dry crackers or toast in the morning before getting out of bed.  Avoid foods and smells that upset your stomach.  Avoid fatty and spicy foods.  Eat 5-6 small meals a day.  Do not drink when eating meals. Drink between meals.  For snacks, eat high-protein foods, such as cheese.  Eat or suck on things that have ginger in them. Ginger helps nausea.  Avoid food preparation. The smell of food can spoil your appetite.  Avoid iron pills and iron in your multivitamins until after 3-4 months of being pregnant. However, consult with your health care provider before stopping any prescribed iron pills. SEEK MEDICAL CARE IF:   Your abdominal pain increases.  You have a severe headache.  You have vision problems.  You are losing weight. SEEK IMMEDIATE MEDICAL CARE IF:   You are unable to keep fluids down.  You vomit blood.  You have constant nausea and vomiting.  You have excessive weakness.  You have extreme thirst.  You have dizziness or fainting.  You have a fever or persistent symptoms for more than 2-3 days.  You have a fever and your symptoms suddenly get worse. MAKE SURE YOU:   Understand these instructions.  Will watch your condition.  Will get help right away if you are not doing well or get worse.   This information is not intended to replace advice given to you by your health care provider. Make sure you discuss any questions you have with your health care provider.   Document Released: 05/25/2005 Document Revised: 03/15/2013 Document Reviewed: 01/04/2013 Elsevier Interactive Patient Education [employer].

## 2015-12-31 NOTE — MAU Note (Signed)
Pt presents to MAU with complaints of not being able to keep anything down except for a small amount of gatorade over the last 36 hours

## 2015-12-31 NOTE — MAU Provider Note (Signed)
Chief Complaint: Morning Sickness   First Provider Initiated Contact with Patient 12/31/15 1348        SUBJECTIVE HPI: Carla Edwards is a 35 y.o. [id number] at [redacted]w[redacted]d by LMP who presents to maternity admissions reporting nausea and vomiting for the past 3 days. She denies vaginal bleeding, vaginal itching/burning, urinary symptoms, h/a, dizziness, or fever/chills.    She was seen by me on 12/24/15 for similar complaints.  Was started on Diclegis at that time, and has not gotten much relief.  Felt good the day after I saw her but then vomiting recurred.  Has lost about 5 lbs.  Emesis   This is a recurrent problem. The current episode started in the past 7 days. The problem occurs 2 to 4 times per day. The problem has been unchanged. There has been no fever. Pertinent negatives include no abdominal pain, chills, diarrhea, dizziness, fever or myalgias. She has tried nothing for the symptoms.   RN Note: Pt presents to MAU with complaints of not being able to keep anything down except for a small amount of gatorade over the last 36 hours   Past Medical History:  Diagnosis Date  . Anxiety   . Headache(784.0)   . Missed ab   . PONV (postoperative nausea and vomiting)    slow to awaken   Past Surgical History:  Procedure Laterality Date  . DILATION AND EVACUATION N/A 09/13/2012   Procedure: DILATATION AND EVACUATION;  Surgeon: Marylynn Pearson, MD;  Location: [facility];  Service: Gynecology;  Laterality: N/A;  . KNEE SURGERY  1993  . WISDOM TOOTH EXTRACTION  37   Social History   Social History  . Marital status: Married    Spouse name: N/A  . Number of children: N/A  . Years of education: N/A   Occupational History  . Not on file.   Social History Main Topics  . Smoking status: Never Smoker  . Smokeless tobacco: Not on file  . Alcohol use No  . Drug use: No  . Sexual activity: Yes    Birth control/ protection: None   Other Topics Concern  . Not on file   Social History Narrative   . No narrative on file   No current facility-administered medications on file prior to encounter.    Current Outpatient Prescriptions on File Prior to Encounter  Medication Sig Dispense Refill  . Prenatal Vit-Fe Fumarate-FA (PRENATAL MULTIVITAMIN) TABS Take 1 tablet by mouth daily at 12 noon.     Allergies  Allergen Reactions  . Sulfa Antibiotics Anaphylaxis    Tongue swelling  . Lorcet 10-650 [Hydrocodone-Acetaminophen]     Does not remember-as child    I have reviewed patient's Past Medical Hx, Surgical Hx, Family Hx, Social Hx, medications and allergies.   ROS:  Review of Systems  Constitutional: Negative for chills and fever.  Respiratory: Negative for shortness of breath.   Gastrointestinal: Positive for nausea and vomiting. Negative for abdominal pain, constipation and diarrhea.  Musculoskeletal: Negative for back pain and myalgias.  Neurological: Positive for light-headedness. Negative for dizziness and syncope.   Other systems negative  Physical Exam  Patient Vitals for the past 24 hrs:  BP Temp Pulse Resp Weight  12/31/15 1335 132/81 98.1 F (36.7 C) 61 18 55.5 kg (122 lb 6.4 oz)    Physical Exam  Vitals:   12/31/15 1335 12/31/15 1738  BP: 132/81 113/70  Pulse: 61 73  Resp: 18 18  Temp: 98.1 F (36.7 C)  Constitutional: Thin, well-developed female in no acute distress, but ill-appearing.  Cardiovascular: normal rate and rhythm Respiratory: normal effort, lungs clear GI: Abd soft, non-tender. Pos BS x 4 MS: Extremities nontender, no edema, normal ROM Neurologic: Alert and oriented x 4.  GU: Neg CVAT.  FHT visualized on bedside ultrasound in 160s  LAB RESULTS Results for orders placed or performed during the hospital encounter of 12/31/15 (from the past 24 hour(s))  Urinalysis, Routine w reflex microscopic (not at [facility])     Status: Abnormal   Collection Time: 12/31/15  1:30 PM  Result Value Ref Range   Color, Urine YELLOW YELLOW   APPearance  HAZY (A) CLEAR   Specific Gravity, Urine 1.025 1.005 - 1.030   pH 5.5 5.0 - 8.0   Glucose, UA NEGATIVE NEGATIVE mg/dL   Hgb urine dipstick NEGATIVE NEGATIVE   Bilirubin Urine NEGATIVE NEGATIVE   Ketones, ur 15 (A) NEGATIVE mg/dL   Protein, ur NEGATIVE NEGATIVE mg/dL   Nitrite NEGATIVE NEGATIVE   Leukocytes, UA NEGATIVE NEGATIVE       IMAGING No results found.  MAU Management/MDM: Consulted Dr Matthew Saras with presentation, results and exam findings IV Phenergan infusion given  IV with MVI given for additional hydration Decided she wants to go home after second bag   ASSESSMENT SIUP at [redacted]w[redacted]d  Hyperemesis  PLAN Discharge home Will Rx Phenergan since Diclegis is not holdingher Follow up in office to check on progress    Medication List    ASK your doctor about these medications   prenatal multivitamin Tabs tablet Take 1 tablet by mouth daily at 12 noon.       Pt stable at time of discharge. Encouraged to return here or to other Urgent Care/ED if she develops worsening of symptoms, increase in pain, fever, or other concerning symptoms.    Hansel Feinstein CNM, MSN Certified Nurse-Midwife 12/31/2015  1:49 PM

## 2016-01-03 LAB — OB RESULTS CONSOLE ABO/RH: RH TYPE: POSITIVE

## 2016-01-03 LAB — OB RESULTS CONSOLE GC/CHLAMYDIA
Chlamydia: NEGATIVE
Gonorrhea: NEGATIVE

## 2016-01-03 LAB — OB RESULTS CONSOLE HEPATITIS B SURFACE ANTIGEN: HEP B S AG: NEGATIVE

## 2016-01-03 LAB — OB RESULTS CONSOLE ANTIBODY SCREEN: ANTIBODY SCREEN: NEGATIVE

## 2016-01-03 LAB — OB RESULTS CONSOLE RPR: RPR: NONREACTIVE

## 2016-01-03 LAB — OB RESULTS CONSOLE HIV ANTIBODY (ROUTINE TESTING): HIV: NONREACTIVE

## 2016-01-03 LAB — OB RESULTS CONSOLE RUBELLA ANTIBODY, IGM: RUBELLA: IMMUNE

## 2016-01-30 ENCOUNTER — Encounter (HOSPITAL_COMMUNITY): Payer: Self-pay

## 2016-01-30 ENCOUNTER — Inpatient Hospital Stay (HOSPITAL_COMMUNITY)
Admission: AD | Admit: 2016-01-30 | Discharge: 2016-01-30 | Disposition: A | Discharge status: Home / Self Care | Payer: BLUE CROSS/BLUE SHIELD | Source: Ambulatory Visit | Attending: Obstetrics and Gynecology | Admitting: Obstetrics and Gynecology

## 2016-01-30 DIAGNOSIS — F419 Anxiety disorder, unspecified: Secondary | ICD-10-CM | POA: Diagnosis not present

## 2016-01-30 DIAGNOSIS — O219 Vomiting of pregnancy, unspecified: Secondary | ICD-10-CM | POA: Diagnosis not present

## 2016-01-30 DIAGNOSIS — Z3A12 12 weeks gestation of pregnancy: Secondary | ICD-10-CM | POA: Diagnosis not present

## 2016-01-30 DIAGNOSIS — O218 Other vomiting complicating pregnancy: Secondary | ICD-10-CM | POA: Insufficient documentation

## 2016-01-30 DIAGNOSIS — O99342 Other mental disorders complicating pregnancy, second trimester: Secondary | ICD-10-CM | POA: Insufficient documentation

## 2016-01-30 DIAGNOSIS — R112 Nausea with vomiting, unspecified: Secondary | ICD-10-CM | POA: Insufficient documentation

## 2016-01-30 DIAGNOSIS — R1110 Vomiting, unspecified: Secondary | ICD-10-CM | POA: Diagnosis present

## 2016-01-30 LAB — COMPREHENSIVE METABOLIC PANEL
ALBUMIN: 4.1 g/dL (ref 3.5–5.0)
ALT: 14 U/L (ref 14–54)
ANION GAP: 10 (ref 5–15)
AST: 19 U/L (ref 15–41)
Alkaline Phosphatase: 61 U/L (ref 38–126)
BUN: 14 mg/dL (ref 6–20)
CHLORIDE: 100 mmol/L — AB (ref 101–111)
CO2: 23 mmol/L (ref 22–32)
CREATININE: 0.57 mg/dL (ref 0.44–1.00)
Calcium: 9.3 mg/dL (ref 8.9–10.3)
GFR calc Af Amer: >60 mL/min (ref >60)
GFR calc non Af Amer: >60 mL/min (ref >60)
GLUCOSE: 82 mg/dL (ref 65–99)
POTASSIUM: 3.4 mmol/L — AB (ref 3.5–5.1)
SODIUM: 133 mmol/L — AB (ref 135–145)
Total Bilirubin: 0.9 mg/dL (ref 0.3–1.2)
Total Protein: 6.9 g/dL (ref 6.5–8.1)

## 2016-01-30 LAB — URINE MICROSCOPIC-ADD ON
BACTERIA UA: NONE SEEN
RBC / HPF: NONE SEEN RBC/hpf (ref 0–5)

## 2016-01-30 LAB — URINALYSIS, ROUTINE W REFLEX MICROSCOPIC
Bilirubin Urine: NEGATIVE
GLUCOSE, UA: NEGATIVE mg/dL
HGB URINE DIPSTICK: NEGATIVE
KETONES UR: 15 mg/dL — AB
Nitrite: NEGATIVE
PROTEIN: NEGATIVE mg/dL
Specific Gravity, Urine: 1.025 (ref 1.005–1.030)
pH: 6.5 (ref 5.0–8.0)

## 2016-01-30 LAB — CBC WITH DIFFERENTIAL/PLATELET
BASOS ABS: 0 10*3/uL (ref 0.0–0.1)
BASOS PCT: 0 %
EOS PCT: 0 %
Eosinophils Absolute: 0 10*3/uL (ref 0.0–0.7)
HCT: 32.5 % — ABNORMAL LOW (ref 36.0–46.0)
Hemoglobin: 11.9 g/dL — ABNORMAL LOW (ref 12.0–15.0)
Lymphocytes Relative: 15 %
Lymphs Abs: 2.1 10*3/uL (ref 0.7–4.0)
MCH: 31.4 pg (ref 26.0–34.0)
MCHC: 36.6 g/dL — AB (ref 30.0–36.0)
MCV: 85.8 fL (ref 78.0–100.0)
MONO ABS: 0.5 10*3/uL (ref 0.1–1.0)
Monocytes Relative: 4 %
NEUTROS ABS: 11.3 10*3/uL — AB (ref 1.7–7.7)
Neutrophils Relative %: 81 %
Platelets: 292 10*3/uL (ref 150–400)
RBC: 3.79 MIL/uL — ABNORMAL LOW (ref 3.87–5.11)
RDW: 12.8 % (ref 11.5–15.5)
WBC: 13.5 10*3/uL — AB (ref 4.0–10.5)

## 2016-01-30 MED ORDER — PROMETHAZINE HCL 25 MG/ML IJ SOLN
25.0000 mg | Freq: Once | INTRAVENOUS | Status: AC
  Administered 2016-01-30: 25 mg via INTRAVENOUS
  Filled 2016-01-30: qty 1

## 2016-01-30 MED ORDER — DEXTROSE 5 % IN LACTATED RINGERS IV BOLUS
1000.0000 mL | Freq: Once | INTRAVENOUS | Status: DC
Start: 2016-01-30 — End: 2016-01-30

## 2016-01-30 MED ORDER — DEXTROSE IN LACTATED RINGERS 5 % IV SOLN
Freq: Once | INTRAVENOUS | Status: AC
  Administered 2016-01-30: 20:00:00 via INTRAVENOUS
  Filled 2016-01-30: qty 1000

## 2016-01-30 NOTE — MAU Note (Signed)
Pt presents to MAU with complaints of not being able to keep anything down for 4 or 5 days. Denies any vaginal bleeding or abnormal discharge

## 2016-01-30 NOTE — MAU Provider Note (Signed)
History     CSN: [id number]  Arrival date and time: 01/30/16 1721   First Provider Initiated Contact with Patient 01/30/16 1752      Chief Complaint  Patient presents with  . Emesis During Pregnancy   HPI Carla Edwards is a 35 y.o. [id number] at [redacted]w[redacted]d who presents to MAU today with complaint of N/V. The patient states that this has been an issue throughout the pregnancy. She had similar issues with previous pregnancies as well. She is currently taking Diclegis at max dosing and Phenergan suppositories PRN, last dose was yesterday. She doesn't feel that they are really helping. She states N/V has been worse x 4 days. She is having a hard time keeping anything down. She denies abdominal pain, heartburn, vaginal bleeding today. She has had headache since Monday. She has not taken anything for pain.   OB History    Gravida Para Term Preterm AB Living   6 2 2   3 2    SAB TAB Ectopic Multiple Live Births   3       2      Past Medical History:  Diagnosis Date  . Anxiety   . Headache(784.0)   . Missed ab   . PONV (postoperative nausea and vomiting)    slow to awaken    Past Surgical History:  Procedure Laterality Date  . DILATION AND EVACUATION N/A 09/13/2012   Procedure: DILATATION AND EVACUATION;  Surgeon: Marylynn Pearson, MD;  Location: [facility];  Service: Gynecology;  Laterality: N/A;  . KNEE SURGERY  1993  . WISDOM TOOTH EXTRACTION  89    No family history on file.  Social History  Substance Use Topics  . Smoking status: Never Smoker  . Smokeless tobacco: Never Used  . Alcohol use No    Allergies:  Allergies  Allergen Reactions  . Sulfa Antibiotics Anaphylaxis    Tongue swelling  . Lorcet 10-650 [Hydrocodone-Acetaminophen]     Does not remember-as child    Prescriptions Prior to Admission  Medication Sig Dispense Refill Last Dose  . DICLEGIS 10-10 MG TBEC Take 2 tablets by mouth at bedtime as needed for nausea/vomiting.  2 01/29/2016 at Unknown time  .  promethazine (PHENERGAN) 25 MG suppository Place 25 mg rectally every 6 (six) hours as needed for nausea or vomiting.   Past Week at Unknown time  . promethazine (PHENERGAN) 25 MG tablet Take 1 tablet (25 mg total) by mouth every 6 (six) hours as needed for nausea or vomiting. 30 tablet 2 Past Month at Unknown time    Review of Systems  Constitutional: Negative for fever and malaise/fatigue.  Gastrointestinal: Positive for nausea and vomiting. Negative for abdominal pain, constipation and diarrhea.  Genitourinary: Negative for dysuria, frequency and urgency.       Neg- vaginal bleeding   Physical Exam   Blood pressure 125/85, pulse 73, temperature 98.7 F (37.1 C), resp. rate 18, weight 125 lb (56.7 kg), last menstrual period 11/06/2015, unknown if currently breastfeeding.  Physical Exam  Nursing note and vitals reviewed. Constitutional: She is oriented to person, place, and time. She appears well-developed and well-nourished. No distress.  HENT:  Head: Normocephalic and atraumatic.  Cardiovascular: Normal rate.   Respiratory: Effort normal.  GI: Soft. She exhibits no distension and no mass. There is no tenderness. There is no rebound and no guarding.  Neurological: She is alert and oriented to person, place, and time.  Skin: Skin is warm and dry. No erythema.  Psychiatric: She has  a normal mood and affect.    Results for orders placed or performed during the hospital encounter of 01/30/16 (from the past 24 hour(s))  Urinalysis, Routine w reflex microscopic (not at [facility])     Status: Abnormal   Collection Time: 01/30/16  5:50 PM  Result Value Ref Range   Color, Urine YELLOW YELLOW   APPearance CLEAR CLEAR   Specific Gravity, Urine 1.025 1.005 - 1.030   pH 6.5 5.0 - 8.0   Glucose, UA NEGATIVE NEGATIVE mg/dL   Hgb urine dipstick NEGATIVE NEGATIVE   Bilirubin Urine NEGATIVE NEGATIVE   Ketones, ur 15 (A) NEGATIVE mg/dL   Protein, ur NEGATIVE NEGATIVE mg/dL   Nitrite NEGATIVE  NEGATIVE   Leukocytes, UA TRACE (A) NEGATIVE  Urine microscopic-add on     Status: Abnormal   Collection Time: 01/30/16  5:50 PM  Result Value Ref Range   Squamous Epithelial / LPF 0-5 (A) NONE SEEN   WBC, UA 0-5 0 - 5 WBC/hpf   RBC / HPF NONE SEEN 0 - 5 RBC/hpf   Bacteria, UA NONE SEEN NONE SEEN   Urine-Other MUCOUS PRESENT   CBC with Differential/Platelet     Status: Abnormal (Preliminary result)   Collection Time: 01/30/16  6:22 PM  Result Value Ref Range   WBC 13.5 (H) 4.0 - 10.5 K/uL   RBC 3.79 (L) 3.87 - 5.11 MIL/uL   Hemoglobin 11.9 (L) 12.0 - 15.0 g/dL   HCT 32.5 (L) 36.0 - 46.0 %   MCV 85.8 78.0 - 100.0 fL   MCH 31.4 26.0 - 34.0 pg   MCHC 36.6 (H) 30.0 - 36.0 g/dL   RDW 12.8 11.5 - 15.5 %   Platelets 292 150 - 400 K/uL   Neutrophils Relative % PENDING %   Neutro Abs PENDING 1.7 - 7.7 K/uL   Band Neutrophils PENDING %   Lymphocytes Relative PENDING %   Lymphs Abs PENDING 0.7 - 4.0 K/uL   Monocytes Relative PENDING %   Monocytes Absolute PENDING 0.1 - 1.0 K/uL   Eosinophils Relative PENDING %   Eosinophils Absolute PENDING 0.0 - 0.7 K/uL   Basophils Relative PENDING %   Basophils Absolute PENDING 0.0 - 0.1 K/uL   WBC Morphology PENDING    RBC Morphology PENDING    Smear Review PENDING    Other PENDING %   nRBC PENDING 0 /100 WBC   Metamyelocytes Relative PENDING %   Myelocytes PENDING %   Promyelocytes Absolute PENDING %   Blasts PENDING %  Comprehensive metabolic panel     Status: Abnormal   Collection Time: 01/30/16  6:22 PM  Result Value Ref Range   Sodium 133 (L) 135 - 145 mmol/L   Potassium 3.4 (L) 3.5 - 5.1 mmol/L   Chloride 100 (L) 101 - 111 mmol/L   CO2 23 22 - 32 mmol/L   Glucose, Bld 82 65 - 99 mg/dL   BUN 14 6 - 20 mg/dL   Creatinine, Ser 0.57 0.44 - 1.00 mg/dL   Calcium 9.3 8.9 - 10.3 mg/dL   Total Protein 6.9 6.5 - 8.1 g/dL   Albumin 4.1 3.5 - 5.0 g/dL   AST 19 15 - 41 U/L   ALT 14 14 - 54 U/L   Alkaline Phosphatase 61 38 - 126 U/L   Total  Bilirubin 0.9 0.3 - 1.2 mg/dL   GFR calc non Af Amer >60 >60 mL/min   GFR calc Af Amer >60 >60 mL/min   Anion gap 10 5 -  15    MAU Course  Procedures None  MDM FHR - 154 bpm with doppler UA today  IV fluids with 25 mg Phenergan given CBC, CMP today  IV D5LR with MV given  Discussed patient with Dr. Matthew Saras. Agrees with plan of care. Follow-up in the office as scheduled.  Assessment and Plan  A: SIUP at [redacted]w[redacted]d Nausea and vomiting in pregnancy prior to [redacted] weeks gestation    P: Discharge home Continue Diclegis on max dose and phenergan PRN Diet for N/V in pregnancy included in AVS First trimester precautions discussed Patient advised to follow-up with [facility] as scheduled for routine prenatal care or sooner PRN Patient may return to MAU as needed or if her condition were to change or worsen   Luvenia Redden, PA-C  01/30/2016, 7:46 PM

## 2016-01-30 NOTE — Discharge Instructions (Signed)
Morning Sickness Morning sickness is when you feel sick to your stomach (nauseous) during pregnancy. You may feel sick to your stomach and throw up (vomit). You may feel sick in the morning, but you can feel this way any time of day. Some women feel very sick to their stomach and cannot stop throwing up (hyperemesis gravidarum). HOME CARE  Only take medicines as told by your doctor.  Take multivitamins as told by your doctor. Taking multivitamins before getting pregnant can stop or lessen the harshness of morning sickness.  Eat dry toast or unsalted crackers before getting out of bed.  Eat 5 to 6 small meals a day.  Eat dry and bland foods like rice and baked potatoes.  Do not drink liquids with meals. Drink between meals.  Do not eat greasy, fatty, or spicy foods.  Have someone cook for you if the smell of food causes you to feel sick or throw up.  If you feel sick to your stomach after taking prenatal vitamins, take them at night or with a snack.  Eat protein when you need a snack (nuts, yogurt, cheese).  Eat unsweetened gelatins for dessert.  Wear a bracelet used for sea sickness (acupressure wristband).  Go to a doctor that puts thin needles into certain body points (acupuncture) to improve how you feel.  Do not smoke.  Use a humidifier to keep the air in your house free of odors.  Get lots of fresh air. GET HELP IF:  You need medicine to feel better.  You feel dizzy or lightheaded.  You are losing weight. GET HELP RIGHT AWAY IF:   You feel very sick to your stomach and cannot stop throwing up.  You pass out (faint). MAKE SURE YOU:  Understand these instructions.  Will watch your condition.  Will get help right away if you are not doing well or get worse.   This information is not intended to replace advice given to you by your health care provider. Make sure you discuss any questions you have with your health care provider.   Document Released: 07/02/2004  Document Revised: 06/15/2014 Document Reviewed: 11/09/2012 Elsevier Interactive Patient Education 2016 [place] for Hyperemesis Gravidarum Severe cases of hyperemesis gravidarum can lead to dehydration and malnutrition. The hyperemesis eating plan is one way to lessen the symptoms of nausea and vomiting. It is often used with prescribed medicines to control your symptoms.  WHAT CAN I DO TO RELIEVE MY SYMPTOMS? Listen to your body. Everyone is different and has different preferences. Find what works best for you. Some of the following things may help:  Eat and drink slowly.  Eat 5-6 small meals daily instead of 3 large meals.   Eat crackers before you get out of bed in the morning.   Starchy foods are usually well tolerated (such as cereal, toast, bread, potatoes, pasta, rice, and pretzels).   Ginger may help with nausea. Add  tsp ground ginger to hot tea or choose ginger tea.   Try drinking 100% fruit juice or an electrolyte drink.  Continue to take your prenatal vitamins as directed by your health care provider. If you are having trouble taking your prenatal vitamins, talk with your health care provider about different options.  Include at least 1 serving of protein with your meals and snacks (such as meats or poultry, beans, nuts, eggs, or yogurt). Try eating a protein-rich snack before bed (such as cheese and crackers or a half [place] or peanut butter sandwich).  WHAT THINGS SHOULD I AVOID TO REDUCE MY SYMPTOMS? The following things may help reduce your symptoms:  Avoid foods with strong smells. Try eating meals in well-ventilated areas that are free of odors.  Avoid drinking water or other beverages with meals. Try not to drink anything less than 30 minutes before and after meals.  Avoid drinking more than 1 cup of fluid at a time.  Avoid fried or high-fat foods, such as butter and cream sauces.  Avoid spicy foods.  Avoid skipping meals the best you can.  Nausea can be more intense on an empty stomach. If you cannot tolerate food at that time, do not force it. Try sucking on ice chips or other frozen items and make up the calories later.  Avoid lying down within 2 hours after eating.   This information is not intended to replace advice given to you by your health care provider. Make sure you discuss any questions you have with your health care provider.   Document Released: 03/22/2007 Document Revised: 05/30/2013 Document Reviewed: 03/29/2013 Elsevier Interactive Patient Education [employer].

## 2016-06-08 NOTE — L&D Delivery Note (Signed)
Delivery Note  SVD viable female Apgars 9,9 over intact perineum.  Placenta delivered spontaneously intact with 3VC. Good support and hemostasis noted and R/V exam confirms.  PH art was sent.  [place] cord blood was not done.  Mother and baby were doing well.  EBL 200cc  Louretta Shorten, MD

## 2016-07-17 ENCOUNTER — Inpatient Hospital Stay (HOSPITAL_COMMUNITY)
Admission: AD | Admit: 2016-07-17 | Discharge: 2016-07-17 | Disposition: A | Discharge status: Home / Self Care | Payer: BLUE CROSS/BLUE SHIELD | Source: Ambulatory Visit | Attending: Obstetrics and Gynecology | Admitting: Obstetrics and Gynecology

## 2016-07-17 ENCOUNTER — Encounter (HOSPITAL_COMMUNITY): Payer: Self-pay

## 2016-07-17 DIAGNOSIS — O99343 Other mental disorders complicating pregnancy, third trimester: Secondary | ICD-10-CM | POA: Diagnosis not present

## 2016-07-17 DIAGNOSIS — K5289 Other specified noninfective gastroenteritis and colitis: Secondary | ICD-10-CM

## 2016-07-17 DIAGNOSIS — K529 Noninfective gastroenteritis and colitis, unspecified: Secondary | ICD-10-CM | POA: Diagnosis present

## 2016-07-17 DIAGNOSIS — Z79899 Other long term (current) drug therapy: Secondary | ICD-10-CM | POA: Diagnosis not present

## 2016-07-17 DIAGNOSIS — O219 Vomiting of pregnancy, unspecified: Secondary | ICD-10-CM

## 2016-07-17 DIAGNOSIS — O212 Late vomiting of pregnancy: Secondary | ICD-10-CM

## 2016-07-17 DIAGNOSIS — Z3A36 36 weeks gestation of pregnancy: Secondary | ICD-10-CM

## 2016-07-17 DIAGNOSIS — Z9889 Other specified postprocedural states: Secondary | ICD-10-CM | POA: Insufficient documentation

## 2016-07-17 DIAGNOSIS — Z888 Allergy status to other drugs, medicaments and biological substances status: Secondary | ICD-10-CM | POA: Diagnosis not present

## 2016-07-17 LAB — URINALYSIS, ROUTINE W REFLEX MICROSCOPIC
Bilirubin Urine: NEGATIVE
GLUCOSE, UA: NEGATIVE mg/dL
HGB URINE DIPSTICK: NEGATIVE
KETONES UR: NEGATIVE mg/dL
Leukocytes, UA: NEGATIVE
Nitrite: NEGATIVE
PROTEIN: NEGATIVE mg/dL
Specific Gravity, Urine: 1.019 (ref 1.005–1.030)
pH: 6 (ref 5.0–8.0)

## 2016-07-17 MED ORDER — PROMETHAZINE HCL 25 MG PO TABS
12.5000 mg | ORAL_TABLET | Freq: Once | ORAL | Status: AC
  Administered 2016-07-17: 12.5 mg via ORAL
  Filled 2016-07-17: qty 1

## 2016-07-17 MED ORDER — PROMETHAZINE HCL 12.5 MG PO TABS: ORAL_TABLET | ORAL | 0 refills | Status: DC

## 2016-07-17 NOTE — Discharge Instructions (Signed)
Nausea and Vomiting, Adult Introduction Feeling sick to your stomach (nausea) means that your stomach is upset or you feel like you have to throw up (vomit). Feeling more and more sick to your stomach can lead to throwing up. Throwing up happens when food and liquid from your stomach are thrown up and out the mouth. Throwing up can make you feel weak and cause you to get dehydrated. Dehydration can make you tired and thirsty, make you have a dry mouth, and make it so you pee (urinate) less often. Older adults and people with other diseases or a weak defense system (immune system) are at higher risk for dehydration. If you feel sick to your stomach or if you throw up, it is important to follow instructions from your doctor about how to take care of yourself. Follow these instructions at home: Eating and drinking Follow these instructions as told by your doctor:  Take an oral rehydration solution (ORS). This is a drink that is sold at pharmacies and stores.  Drink clear fluids in small amounts as you are able, such as:  Water.  Ice chips.  Diluted fruit juice.  Low-calorie sports drinks.  Eat bland, easy-to-digest foods in small amounts as you are able, such as:  Bananas.  Applesauce.  Rice.  Low-fat (lean) meats.  Toast.  Crackers.  Avoid fluids that have a lot of sugar or caffeine in them.  Avoid alcohol.  Avoid spicy or fatty foods. General instructions  Drink enough fluid to keep your pee (urine) clear or pale yellow.  Wash your hands often. If you cannot use soap and water, use hand sanitizer.  Make sure that all people in your home wash their hands well and often.  Take over-the-counter and prescription medicines only as told by your doctor.  Rest at home while you get better.  Watch your condition for any changes.  Breathe slowly and deeply when you feel sick to your stomach.  Keep all follow-up visits as told by your doctor. This is important. Contact a  doctor if:  You have a fever.  You cannot keep fluids down.  Your symptoms get worse.  You have new symptoms.  You feel sick to your stomach for more than two days.  You feel light-headed or dizzy.  You have a headache.  You have muscle cramps. Get help right away if:  You have pain in your chest, neck, arm, or jaw.  You feel very weak or you pass out (faint).  You throw up again and again.  You see blood in your throw-up.  Your throw-up looks like black coffee grounds.  You have bloody or black poop (stools) or poop that look like tar.  You have a very bad headache, a stiff neck, or both.  You have a rash.  You have very bad pain, cramping, or bloating in your belly (abdomen).  You have trouble breathing.  You are breathing very quickly.  Your heart is beating very quickly.  Your skin feels cold and clammy.  You feel confused.  You have pain when you pee.  You have signs of dehydration, such as:  Dark pee, hardly any pee, or no pee.  Cracked lips.  Dry mouth.  Sunken eyes.  Sleepiness.  Weakness. These symptoms may be an emergency. Do not wait to see if the symptoms will go away. Get medical help right away. Call your local emergency services (911 in the [place] Do not drive yourself to the hospital.  This information   is not intended to replace advice given to you by your health care provider. Make sure you discuss any questions you have with your health care provider. Document Released: 11/11/2007 Document Revised: 12/13/2015 Document Reviewed: 01/29/2015  2017 Elsevier  

## 2016-07-17 NOTE — MAU Provider Note (Signed)
Chief Complaint:  Emesis   HPI: Carla Edwards is a 36 y.o. [id number] at [redacted]w[redacted]d who presents to maternity admissions reporting emesis.  Patient is here due to intractable vomiting. Patient started vomiting this AM, food, water, bile, could not hold anything down, no hematemesis. Patient states she has had emesis throughout the pregnancy, but today is much worse than usual. Did not take any medicine today, but usually takes Diclegis. Denies diarrhea. Had emesis about 10 times already today. Admitting to some contractions, worsened with emesis. No undercooked meats, mayonnaise, etc. No fast food. No urinary symptoms.  Son recently ill with fever, and body aches, had 7 students with +influenza, he tested negative but pediatrician started him on Tamiflu. No other sick contacts at home.   Denies leakage of fluid or vaginal bleeding. Good fetal movement.   Pregnancy Course:   Past Medical History: Past Medical History:  Diagnosis Date  . Anxiety   . Headache(784.0)   . Missed ab   . PONV (postoperative nausea and vomiting)    slow to awaken    Past obstetric history: OB History  Gravida Para Term Preterm AB Living  6 2 2   3 2   SAB TAB Ectopic Multiple Live Births  3       2    # Outcome Date GA Lbr Len/2nd Weight Sex Delivery Anes PTL Lv  6 Current           5 Term 08/08/13 [redacted]w[redacted]d 06:42 / 00:03 7 lb 3.9 oz (3.286 kg) M Vag-Spont EPI  LIV  4 SAB 09/08/12          3 Term 06/09/10 [redacted]w[redacted]d   M Vag-Spont EPI N LIV  2 SAB           1 SAB               Past Surgical History: Past Surgical History:  Procedure Laterality Date  . DILATION AND EVACUATION N/A 09/13/2012   Procedure: DILATATION AND EVACUATION;  Surgeon: Marylynn Pearson, MD;  Location: [facility];  Service: Gynecology;  Laterality: N/A;  . KNEE SURGERY  1993  . WISDOM TOOTH EXTRACTION  51     Family History: No family history on file.  Social History: Social History  Substance Use Topics  . Smoking status: Never Smoker  .  Smokeless tobacco: Never Used  . Alcohol use No    Allergies:  Allergies  Allergen Reactions  . Sulfa Antibiotics Anaphylaxis    Tongue swelling  . Lorcet 10-650 [Hydrocodone-Acetaminophen]     Does not remember-as child    Meds:  Prescriptions Prior to Admission  Medication Sig Dispense Refill Last Dose  . DICLEGIS 10-10 MG TBEC Take 2 tablets by mouth at bedtime as needed for nausea/vomiting.  2 Past Week at Unknown time  . Prenatal Vit-Fe Fumarate-FA (PRENATAL MULTIVITAMIN) TABS tablet Take 1 tablet by mouth daily at 12 noon.   Past Week at Unknown time  . ranitidine (ZANTAC) 150 MG tablet Take 150 mg by mouth 2 (two) times daily as needed for heartburn.   Past Week at Unknown time  . promethazine (PHENERGAN) 25 MG tablet Take 1 tablet (25 mg total) by mouth every 6 (six) hours as needed for nausea or vomiting. 30 tablet 2 Past Month at Unknown time    I have reviewed patient's Past Medical Hx, Surgical Hx, Family Hx, Social Hx, medications and allergies.   ROS:  A comprehensive ROS was negative except per HPI.    Physical  Exam   Patient Vitals for the past 24 hrs:  BP Temp Temp src Pulse Resp Weight  07/17/16 1329 126/72 97.8 F (36.6 C) Oral 74 18 -  07/17/16 1323 - - - - - 149 lb 0.6 oz (67.6 kg)   Constitutional: Well-developed, well-nourished female in no acute distress.  Cardiovascular: normal rate, rhythm, no murmurs Respiratory: normal effort, CTAB GI: Abd soft, non-tender, gravid appropriate for gestational age.   MS: Extremities nontender, no edema, normal ROM Neurologic: Alert and oriented x 4.  GU: Neg CVAT. Skin: Normal turgor, good capillary refill (2 sec).   Labs: Results for orders placed or performed during the hospital encounter of 07/17/16 (from the past 24 hour(s))  Urinalysis, Routine w reflex microscopic     Status: None   Collection Time: 07/17/16  1:20 PM  Result Value Ref Range   Color, Urine YELLOW YELLOW   APPearance CLEAR CLEAR    Specific Gravity, Urine 1.019 1.005 - 1.030   pH 6.0 5.0 - 8.0   Glucose, UA NEGATIVE NEGATIVE mg/dL   Hgb urine dipstick NEGATIVE NEGATIVE   Bilirubin Urine NEGATIVE NEGATIVE   Ketones, ur NEGATIVE NEGATIVE mg/dL   Protein, ur NEGATIVE NEGATIVE mg/dL   Nitrite NEGATIVE NEGATIVE   Leukocytes, UA NEGATIVE NEGATIVE    Imaging:  No results found.  MAU Course: UA - clean Phenergan PO trial - TOLERATED AFTER PHENERGAN WITH WATER AND CRACKERS  I personally reviewed the patient's NST today, found to be REACTIVE. 130 bpm, mod var, +accels, no decels. CTX: 2-5 min but patient barely feeling them.    MDM: Plan of care reviewed with patient, including labs and tests ordered and medical treatment. Discussed with patient no signs of dehydration, but when able to tolerate, to catch up with PO fluid intake. Bland foods discussed. Return if symptoms worsen or fevers begin.   Assessment: 1. Vomiting affecting pregnancy   2. Other noninfectious gastroenteritis     Plan: Discharge home in stable condition.  Preterm Labor precautions and fetal kick counts Encouraged PO hydration when able to tolerate fluids Phenergan prn  Follow-up Information    [facility]. Go in 1 week(s).   Why:  Routine OB care Contact information: [address] [place] [address] [phone]           Allergies as of 07/17/2016      Reactions   Sulfa Antibiotics Anaphylaxis   Tongue swelling   Lorcet 10-650 [hydrocodone-acetaminophen]    Does not remember-as child      Medication List    TAKE these medications   DICLEGIS 10-10 MG Tbec Generic drug:  Doxylamine-Pyridoxine Take 2 tablets by mouth at bedtime as needed for nausea/vomiting.   prenatal multivitamin Tabs tablet Take 1 tablet by mouth daily at 12 noon.   promethazine 25 MG tablet Commonly known as:  PHENERGAN Take 1 tablet (25 mg total) by mouth every 6 (six) hours as needed for nausea or  vomiting. What changed:  Another medication with the same name was added. Make sure you understand how and when to take each.   promethazine 12.5 MG tablet Commonly known as:  PHENERGAN Take 1-2 tablets PO every 6 hours as needed for nausea and vomiting. What changed:  You were already taking a medication with the same name, and this prescription was added. Make sure you understand how and when to take each.   ranitidine 150 MG tablet Commonly known as:  ZANTAC Take 150 mg  by mouth 2 (two) times daily as needed for heartburn.       Katherine Basset, DO [facility], [facility] 07/17/2016 3:10 PM

## 2016-07-17 NOTE — MAU Note (Signed)
Vomiting since this morning, denies diarrhea, has not been able to keep anything down.

## 2016-08-06 ENCOUNTER — Encounter (HOSPITAL_COMMUNITY): Payer: Self-pay | Admitting: *Deleted

## 2016-08-06 ENCOUNTER — Telehealth (HOSPITAL_COMMUNITY): Payer: Self-pay | Admitting: *Deleted

## 2016-08-06 LAB — OB RESULTS CONSOLE GBS: GBS: NEGATIVE

## 2016-08-06 NOTE — Telephone Encounter (Signed)
Preadmission screen  

## 2016-08-11 ENCOUNTER — Encounter (HOSPITAL_COMMUNITY): Payer: Self-pay | Admitting: *Deleted

## 2016-08-11 ENCOUNTER — Inpatient Hospital Stay (HOSPITAL_COMMUNITY)
Admission: AD | Admit: 2016-08-11 | Discharge: 2016-08-13 | DRG: 775 | Disposition: A | Discharge status: Home / Self Care | Payer: BLUE CROSS/BLUE SHIELD | Source: Ambulatory Visit | Attending: Obstetrics and Gynecology | Admitting: Obstetrics and Gynecology

## 2016-08-11 ENCOUNTER — Inpatient Hospital Stay (HOSPITAL_COMMUNITY): Payer: BLUE CROSS/BLUE SHIELD | Admitting: Anesthesiology

## 2016-08-11 DIAGNOSIS — Z3A39 39 weeks gestation of pregnancy: Secondary | ICD-10-CM

## 2016-08-11 DIAGNOSIS — Z833 Family history of diabetes mellitus: Secondary | ICD-10-CM | POA: Diagnosis not present

## 2016-08-11 DIAGNOSIS — Z8249 Family history of ischemic heart disease and other diseases of the circulatory system: Secondary | ICD-10-CM

## 2016-08-11 DIAGNOSIS — Z3493 Encounter for supervision of normal pregnancy, unspecified, third trimester: Secondary | ICD-10-CM | POA: Diagnosis present

## 2016-08-11 LAB — CBC
HCT: 32.8 % — ABNORMAL LOW (ref 36.0–46.0)
Hemoglobin: 11.2 g/dL — ABNORMAL LOW (ref 12.0–15.0)
MCH: 31.1 pg (ref 26.0–34.0)
MCHC: 34.1 g/dL (ref 30.0–36.0)
MCV: 91.1 fL (ref 78.0–100.0)
PLATELETS: 281 10*3/uL (ref 150–400)
RBC: 3.6 MIL/uL — AB (ref 3.87–5.11)
RDW: 12.8 % (ref 11.5–15.5)
WBC: 15.5 10*3/uL — AB (ref 4.0–10.5)

## 2016-08-11 LAB — TYPE AND SCREEN
ABO/RH(D): O POS
Antibody Screen: NEGATIVE

## 2016-08-11 MED ORDER — OXYCODONE-ACETAMINOPHEN 5-325 MG PO TABS: 1.0000 | ORAL_TABLET | ORAL | Status: DC | PRN

## 2016-08-11 MED ORDER — SENNOSIDES-DOCUSATE SODIUM 8.6-50 MG PO TABS
2.0000 | ORAL_TABLET | ORAL | Status: DC
  Administered 2016-08-12 – 2016-08-13 (×2): 2 via ORAL
  Filled 2016-08-11 (×2): qty 2

## 2016-08-11 MED ORDER — PRENATAL MULTIVITAMIN CH
1.0000 | ORAL_TABLET | Freq: Every day | ORAL | Status: DC
  Administered 2016-08-12: 1 via ORAL
  Filled 2016-08-11: qty 1

## 2016-08-11 MED ORDER — WITCH HAZEL-GLYCERIN EX PADS: 1.0000 "application " | MEDICATED_PAD | CUTANEOUS | Status: DC | PRN

## 2016-08-11 MED ORDER — ZOLPIDEM TARTRATE 5 MG PO TABS: 5.0000 mg | ORAL_TABLET | Freq: Every evening | ORAL | Status: DC | PRN

## 2016-08-11 MED ORDER — OXYCODONE-ACETAMINOPHEN 5-325 MG PO TABS: 2.0000 | ORAL_TABLET | ORAL | Status: DC | PRN

## 2016-08-11 MED ORDER — SIMETHICONE 80 MG PO CHEW: 80.0000 mg | CHEWABLE_TABLET | ORAL | Status: DC | PRN

## 2016-08-11 MED ORDER — DIBUCAINE 1 % RE OINT: 1.0000 "application " | TOPICAL_OINTMENT | RECTAL | Status: DC | PRN

## 2016-08-11 MED ORDER — PHENYLEPHRINE 40 MCG/ML (10ML) SYRINGE FOR IV PUSH (FOR BLOOD PRESSURE SUPPORT)
80.0000 ug | PREFILLED_SYRINGE | INTRAVENOUS | Status: DC | PRN
Start: 2016-08-11 — End: 2016-08-11
  Filled 2016-08-11: qty 5

## 2016-08-11 MED ORDER — COCONUT OIL OIL: 1.0000 "application " | TOPICAL_OIL | Status: DC | PRN

## 2016-08-11 MED ORDER — EPHEDRINE 5 MG/ML INJ
10.0000 mg | INTRAVENOUS | Status: DC | PRN
Start: 2016-08-11 — End: 2016-08-11
  Filled 2016-08-11: qty 4

## 2016-08-11 MED ORDER — DIPHENHYDRAMINE HCL 50 MG/ML IJ SOLN: 12.5000 mg | INTRAMUSCULAR | Status: DC | PRN

## 2016-08-11 MED ORDER — MEASLES, MUMPS & RUBELLA VAC ~~LOC~~ INJ: 0.5000 mL | INJECTION | Freq: Once | SUBCUTANEOUS | Status: DC

## 2016-08-11 MED ORDER — DIPHENHYDRAMINE HCL 25 MG PO CAPS: 25.0000 mg | ORAL_CAPSULE | Freq: Four times a day (QID) | ORAL | Status: DC | PRN

## 2016-08-11 MED ORDER — IBUPROFEN 600 MG PO TABS
600.0000 mg | ORAL_TABLET | Freq: Four times a day (QID) | ORAL | Status: DC
  Administered 2016-08-11 – 2016-08-13 (×6): 600 mg via ORAL
  Filled 2016-08-11 (×6): qty 1

## 2016-08-11 MED ORDER — OXYTOCIN BOLUS FROM INFUSION
500.0000 mL | Freq: Once | INTRAVENOUS | Status: AC
  Administered 2016-08-11: 500 mL via INTRAVENOUS

## 2016-08-11 MED ORDER — TETANUS-DIPHTH-ACELL PERTUSSIS 5-2.5-18.5 LF-MCG/0.5 IM SUSP: 0.5000 mL | Freq: Once | INTRAMUSCULAR | Status: DC

## 2016-08-11 MED ORDER — LACTATED RINGERS IV SOLN
500.0000 mL | Freq: Once | INTRAVENOUS | Status: AC
  Administered 2016-08-11: 500 mL via INTRAVENOUS

## 2016-08-11 MED ORDER — SOD CITRATE-CITRIC ACID 500-334 MG/5ML PO SOLN: 30.0000 mL | ORAL | Status: DC | PRN

## 2016-08-11 MED ORDER — LACTATED RINGERS IV SOLN
INTRAVENOUS | Status: DC
  Administered 2016-08-11: 16:00:00 via INTRAVENOUS

## 2016-08-11 MED ORDER — FENTANYL 2.5 MCG/ML BUPIVACAINE 1/10 % EPIDURAL INFUSION (WH - ANES)
14.0000 mL/h | INTRAMUSCULAR | Status: DC | PRN
  Administered 2016-08-11: 14 mL/h via EPIDURAL
  Filled 2016-08-11: qty 100

## 2016-08-11 MED ORDER — LACTATED RINGERS IV SOLN: 500.0000 mL | INTRAVENOUS | Status: DC | PRN

## 2016-08-11 MED ORDER — PHENYLEPHRINE 40 MCG/ML (10ML) SYRINGE FOR IV PUSH (FOR BLOOD PRESSURE SUPPORT)
80.0000 ug | PREFILLED_SYRINGE | INTRAVENOUS | Status: DC | PRN
Start: 2016-08-11 — End: 2016-08-11
  Filled 2016-08-11: qty 5
  Filled 2016-08-11 (×2): qty 10

## 2016-08-11 MED ORDER — MEDROXYPROGESTERONE ACETATE 150 MG/ML IM SUSP: 150.0000 mg | INTRAMUSCULAR | Status: DC | PRN

## 2016-08-11 MED ORDER — ONDANSETRON HCL 4 MG/2ML IJ SOLN: 4.0000 mg | Freq: Four times a day (QID) | INTRAMUSCULAR | Status: DC | PRN

## 2016-08-11 MED ORDER — ONDANSETRON HCL 4 MG PO TABS: 4.0000 mg | ORAL_TABLET | ORAL | Status: DC | PRN

## 2016-08-11 MED ORDER — OXYTOCIN 40 UNITS IN LACTATED RINGERS INFUSION - SIMPLE MED
2.5000 [IU]/h | INTRAVENOUS | Status: DC
  Filled 2016-08-11: qty 1000

## 2016-08-11 MED ORDER — BENZOCAINE-MENTHOL 20-0.5 % EX AERO: 1.0000 "application " | INHALATION_SPRAY | CUTANEOUS | Status: DC | PRN

## 2016-08-11 MED ORDER — ACETAMINOPHEN 325 MG PO TABS: 650.0000 mg | ORAL_TABLET | ORAL | Status: DC | PRN

## 2016-08-11 MED ORDER — ACETAMINOPHEN 325 MG PO TABS
650.0000 mg | ORAL_TABLET | ORAL | Status: DC | PRN
  Administered 2016-08-12: 650 mg via ORAL
  Filled 2016-08-11: qty 2

## 2016-08-11 MED ORDER — EPHEDRINE 5 MG/ML INJ
10.0000 mg | INTRAVENOUS | Status: DC | PRN
  Filled 2016-08-11: qty 4

## 2016-08-11 MED ORDER — ONDANSETRON HCL 4 MG/2ML IJ SOLN
4.0000 mg | INTRAMUSCULAR | Status: DC | PRN
Start: 2016-08-11 — End: 2016-08-13

## 2016-08-11 MED ORDER — FLEET ENEMA 7-19 GM/118ML RE ENEM: 1.0000 | ENEMA | RECTAL | Status: DC | PRN

## 2016-08-11 MED ORDER — LIDOCAINE HCL (PF) 1 % IJ SOLN
INTRAMUSCULAR | Status: DC | PRN
  Administered 2016-08-11 (×2): 5 mL

## 2016-08-11 MED ORDER — LIDOCAINE HCL (PF) 1 % IJ SOLN
30.0000 mL | INTRAMUSCULAR | Status: DC | PRN
  Filled 2016-08-11: qty 30

## 2016-08-11 NOTE — H&P (Signed)
Carla Edwards is a 36 y.o. female presenting for SOL.  Pregnancy complicated by ama declining screening.  GBS-. OB History    Gravida Para Term Preterm AB Living   6 2 2   3 2    SAB TAB Ectopic Multiple Live Births   3       2     Past Medical History:  Diagnosis Date  . AMA (advanced maternal age) multigravida 53+   . Anxiety   . Headache(784.0)   . Hyperemesis   . Missed ab   . PONV (postoperative nausea and vomiting)    slow to awaken   Past Surgical History:  Procedure Laterality Date  . DILATION AND EVACUATION N/A 09/13/2012   Procedure: DILATATION AND EVACUATION;  Surgeon: Marylynn Pearson, MD;  Location: [facility];  Service: Gynecology;  Laterality: N/A;  . KNEE SURGERY  1993  . WISDOM TOOTH EXTRACTION  69   Family History: family history includes Cancer in her maternal grandmother; Depression in her father; Diabetes in her father and maternal grandfather; Hypertension in her maternal grandfather; Thyroid disease in her mother. Social History:  reports that she has never smoked. She has never used smokeless tobacco. She reports that she does not drink alcohol or use drugs.     Maternal Diabetes: No Genetic Screening: Normal Maternal Ultrasounds/Referrals: Normal Fetal Ultrasounds or other Referrals:  None Maternal Substance Abuse:  No Significant Maternal Medications:  None Significant Maternal Lab Results:  None Other Comments:  None  ROS History Dilation: 4.5 Effacement (%): 70 Station: -2 Exam by:: Wilhemena Durie Blood pressure 126/77, pulse 78, temperature 97.5 F (36.4 C), resp. rate 18, height 5\' 2"  (1.575 m), weight 151 lb (68.5 kg), last menstrual period 11/06/2015, unknown if currently breastfeeding. Exam Physical Exam  Prenatal labs: ABO, Rh: O/Positive/-- (07/28 0000) Antibody: Negative (07/28 0000) Rubella: Immune (07/28 0000) RPR: Nonreactive (07/28 0000)  HBsAg: Negative (07/28 0000)  HIV: Non-reactive (07/28 0000)  GBS: Negative (03/01 0000)    Assessment/Plan: IUP at term Active labor Anticipate SVD.  Will AROM after epidural   Carla Edwards C 08/11/2016, 4:27 PM

## 2016-08-11 NOTE — Anesthesia Procedure Notes (Signed)
Epidural Patient location during procedure: OB  Staffing Anesthesiologist: Everline Mahaffy Performed: anesthesiologist   Preanesthetic Checklist Completed: patient identified, site marked, surgical consent, pre-op evaluation, timeout performed, IV checked, risks and benefits discussed and monitors and equipment checked  Epidural Patient position: sitting Prep: DuraPrep Patient monitoring: heart rate, continuous pulse ox and blood pressure Approach: right paramedian Location: L3-L4 Injection technique: LOR saline  Needle:  Needle type: Tuohy  Needle gauge: 17 G Needle length: 9 cm and 9 Needle insertion depth: 4 cm Catheter type: closed end flexible Catheter size: 20 Guage Catheter at skin depth: 8 cm Test dose: negative  Assessment Events: blood not aspirated, injection not painful, no injection resistance, negative IV test and no paresthesia  Additional Notes Patient identified. Risks/Benefits/Options discussed with patient including but not limited to bleeding, infection, nerve damage, paralysis, failed block, incomplete pain control, headache, blood pressure changes, nausea, vomiting, reactions to medication both or allergic, itching and postpartum back pain. Confirmed with bedside nurse the patient's most recent platelet count. Confirmed with patient that they are not currently taking any anticoagulation, have any bleeding history or any family history of bleeding disorders. Patient expressed understanding and wished to proceed. All questions were answered. Sterile technique was used throughout the entire procedure. Please see nursing notes for vital signs. Test dose was given through epidural needle and negative prior to continuing to dose epidural or start infusion. Warning signs of high block given to the patient including shortness of breath, tingling/numbness in hands, complete motor block, or any concerning symptoms with instructions to call for help. Patient was given  instructions on fall risk and not to get out of bed. All questions and concerns addressed with instructions to call with any issues.     

## 2016-08-11 NOTE — MAU Note (Signed)
Pt presents to MAU with complaints of contractions. Was checked in the office today and was 4cms.

## 2016-08-11 NOTE — Anesthesia Preprocedure Evaluation (Signed)
Anesthesia Evaluation  Patient identified by MRN, date of birth, ID band Patient awake    Reviewed: Allergy & Precautions, H&P , NPO status , Patient's Chart, lab work & pertinent test results  History of Anesthesia Complications (+) PONVNegative for: history of anesthetic complications  Airway Mallampati: II  TM Distance: >3 FB Neck ROM: full    Dental no notable dental hx. (+) Teeth Intact   Pulmonary neg pulmonary ROS,    Pulmonary exam normal breath sounds clear to auscultation       Cardiovascular negative cardio ROS Normal cardiovascular exam Rhythm:regular Rate:Normal     Neuro/Psych negative neurological ROS  negative psych ROS   GI/Hepatic negative GI ROS, Neg liver ROS,   Endo/Other  negative endocrine ROS  Renal/GU negative Renal ROS  negative genitourinary   Musculoskeletal   Abdominal   Peds  Hematology negative hematology ROS (+)   Anesthesia Other Findings   Reproductive/Obstetrics (+) Pregnancy                             Anesthesia Physical Anesthesia Plan  ASA: II  Anesthesia Plan: Epidural   Post-op Pain Management:    Induction:   Airway Management Planned:   Additional Equipment:   Intra-op Plan:   Post-operative Plan:   Informed Consent: I have reviewed the patients History and Physical, chart, labs and discussed the procedure including the risks, benefits and alternatives for the proposed anesthesia with the patient or authorized representative who has indicated his/her understanding and acceptance.     Plan Discussed with:   Anesthesia Plan Comments:         Anesthesia Quick Evaluation

## 2016-08-12 LAB — CBC
HCT: 30.8 % — ABNORMAL LOW (ref 36.0–46.0)
Hemoglobin: 10.7 g/dL — ABNORMAL LOW (ref 12.0–15.0)
MCH: 32 pg (ref 26.0–34.0)
MCHC: 34.7 g/dL (ref 30.0–36.0)
MCV: 92.2 fL (ref 78.0–100.0)
Platelets: 224 10*3/uL (ref 150–400)
RBC: 3.34 MIL/uL — ABNORMAL LOW (ref 3.87–5.11)
RDW: 12.9 % (ref 11.5–15.5)
WBC: 14 10*3/uL — ABNORMAL HIGH (ref 4.0–10.5)

## 2016-08-12 LAB — RPR: RPR Ser Ql: NONREACTIVE

## 2016-08-12 NOTE — Progress Notes (Signed)
MOB was referred for history of depression/anxiety. * Referral screened out by Clinical Social Worker because none of the following criteria appear to apply: ~ History of anxiety/depression during this pregnancy, or of post-partum depression. ~ Diagnosis of anxiety and/or depression within last 3 years OR * MOB's symptoms currently being treated with medication and/or therapy. Please contact the Clinical Social Worker if needs arise, or if MOB requests.  Of note: Hx of Anxiety is not documented in MOB's PNR.  It is, however, noted that her father has a hx of Depression.  Anxiety is noted in H&P with diagnosis greater than 3 years ago.

## 2016-08-12 NOTE — Progress Notes (Signed)
S: No complaints. Feeling well. Lochia appropriate. No subjective fevers/chills.   O:  Vitals:   08/11/16 2040 08/12/16 0037  BP: 121/61 122/71  Pulse: 78 70  Resp: 16   Temp: 98.1 F (36.7 C) 98.2 F (36.8 C)    Gen: NAD, A&O Pulm: NWOB Abd: soft, appropriately ttp, fundus firm and below Umb Ext: No evidence of DVT, trace edema b/l  Labs CBC    Component Value Date/Time   WBC 14.0 (H) 08/12/2016 0530   RBC 3.34 (L) 08/12/2016 0530   HGB 10.7 (L) 08/12/2016 0530   HCT 30.8 (L) 08/12/2016 0530   PLT 224 08/12/2016 0530   MCV 92.2 08/12/2016 0530   MCH 32.0 08/12/2016 0530   MCHC 34.7 08/12/2016 0530   RDW 12.9 08/12/2016 0530   LYMPHSABS 2.1 01/30/2016 1822   MONOABS 0.5 01/30/2016 1822   EOSABS 0.0 01/30/2016 1822   BASOSABS 0.0 01/30/2016 1822     A/P:  PPD1 s/p SVD, doing well pp. AFVSS. Benign exam.  No sig med/surg issues. Female infant - "ruth, doing well".  Continue present care. Plan for d/c PPD#2.   Lucillie Garfinkel MD

## 2016-08-12 NOTE — Lactation Note (Signed)
This note was copied from a baby's chart. Lactation Consultation Note  Patient Name: Girl Arora Coakley Today's Date: 08/12/2016   Visited with Mom with her 3rd baby to breastfeed, baby 67 hrs old, born at [redacted]w[redacted]d.  Baby has breastfed 3 times already, and had 3 stools, and 1 void.  Mom holding baby in arms, wrapped in blanket.  Encouraged Mom to hold baby STS as much as possible, and to feed often on cue.  Mom denies any difficulty with latching, and doesn't have any questions at this point.  Mom aware of IP and OP lactation services available to her.  Encouraged Mom to call for assistance prn.  Lactation to follow up prn.    Broadus John 08/12/2016, 11:48 AM

## 2016-08-12 NOTE — Anesthesia Postprocedure Evaluation (Signed)
Anesthesia Post Note  Patient: Carla Edwards  Procedure(s) Performed: * No procedures listed *  Patient location during evaluation: Mother Baby Anesthesia Type: Epidural Level of consciousness: awake and alert and oriented Pain management: pain level controlled Vital Signs Assessment: post-procedure vital signs reviewed and stable Respiratory status: spontaneous breathing and nonlabored ventilation Cardiovascular status: stable Postop Assessment: no headache, no backache, epidural receding, patient able to bend at knees, no signs of nausea or vomiting and adequate PO intake Anesthetic complications: no        Last Vitals:  Vitals:   08/11/16 2040 08/12/16 0037  BP: 121/61 122/71  Pulse: 78 70  Resp: 16   Temp: 36.7 C 36.8 C    Last Pain:  Vitals:   08/12/16 0604  TempSrc:   PainSc: 0-No pain   Pain Goal: Patients Stated Pain Goal: 2 (08/11/16 1830)               Jabier Mutton

## 2016-08-13 ENCOUNTER — Inpatient Hospital Stay (HOSPITAL_COMMUNITY): Admission: RE | Admit: 2016-08-13 | Payer: BLUE CROSS/BLUE SHIELD | Source: Ambulatory Visit

## 2016-08-13 NOTE — Lactation Note (Signed)
This note was copied from a baby's chart. Lactation Consultation Note  Latched upon entering.  Sucks and swallows observed. Mother denies soreness. Answered questions from FOB about washing pumping parts. Mom encouraged to feed baby 8-12 times/24 hours and with feeding cues.  Reviewed engorgement care and monitoring voids/stools.    Patient Name: Girl Carlon Davidson PQAES'L Date: 08/13/2016 Reason for consult: Follow-up assessment   Maternal Data    Feeding Feeding Type: Breast Fed Length of feed: 30 min  LATCH Score/Interventions Latch: Grasps breast easily, tongue down, lips flanged, rhythmical sucking.  Audible Swallowing: Spontaneous and intermittent  Type of Nipple: Everted at rest and after stimulation  Comfort (Breast/Nipple): Filling, red/small blisters or bruises, mild/mod discomfort  Problem noted: Filling  Hold (Positioning): No assistance needed to correctly position infant at breast.  LATCH Score: 9  Lactation Tools Discussed/Used     Consult Status Consult Status: Complete    Carlye Grippe 08/13/2016, 11:07 AM

## 2016-08-13 NOTE — Addendum Note (Signed)
Addendum  created 08/13/16 1029 by Effie Berkshire, MD   Anesthesia Staff edited

## 2016-08-13 NOTE — Discharge Summary (Signed)
Obstetric Discharge Summary Reason for Admission: induction of labor Prenatal Procedures: none Intrapartum Procedures: spontaneous vaginal delivery Postpartum Procedures: none Complications-Operative and Postpartum: none Hemoglobin  Date Value Ref Range Status  08/12/2016 10.7 (L) 12.0 - 15.0 g/dL Final   HCT  Date Value Ref Range Status  08/12/2016 30.8 (L) 36.0 - 46.0 % Final    Physical Exam:  General: alert, cooperative and appears stated age 36: appropriate Uterine Fundus: firm Incision: healing well, no significant drainage, no dehiscence DVT Evaluation: No evidence of DVT seen on physical exam.  Discharge Diagnoses: Term Pregnancy-delivered  Discharge Information: Date: 08/13/2016 Activity: pelvic rest Diet: routine Medications: None Condition: improved Instructions: refer to practice specific booklet Discharge to: home   Newborn Data: Live born female  Birth Weight: 7 lb 14.8 oz (3595 g) APGAR: 9, 9  Home with mother.  Kailey Esquilin L 08/13/2016, 9:33 AM

## 2016-08-17 ENCOUNTER — Inpatient Hospital Stay (HOSPITAL_COMMUNITY): Payer: BLUE CROSS/BLUE SHIELD

## 2016-09-30 LAB — HM PAP SMEAR: HM Pap smear: NEGATIVE

## 2017-10-29 ENCOUNTER — Ambulatory Visit: Payer: BLUE CROSS/BLUE SHIELD | Admitting: Family Medicine

## 2017-10-29 ENCOUNTER — Encounter: Payer: Self-pay | Admitting: Family Medicine

## 2017-10-29 VITALS — BP 114/76 | HR 68 | Temp 98.8°F | Ht 65.75 in | Wt 117.5 lb

## 2017-10-29 DIAGNOSIS — D171 Benign lipomatous neoplasm of skin and subcutaneous tissue of trunk: Secondary | ICD-10-CM | POA: Diagnosis not present

## 2017-10-29 DIAGNOSIS — Z0000 Encounter for general adult medical examination without abnormal findings: Secondary | ICD-10-CM | POA: Diagnosis not present

## 2017-10-29 NOTE — Progress Notes (Signed)
Patient: Carla Edwards MRN: [id number] DOB: 10/24/1980 PCP: No primary care provider on file.     Subjective:  Chief Complaint  Patient presents with  . Establish Care  . lump above navel    dime sized soft lump on stomach.  pt states non tender    HPI: The patient is a 37 y.o. female who presents today for annual exam. She denies any changes to past medical history. There have been no recent hospitalizations. She just had surgery on her left eye lid (excision of a chalazion). They are following a well balanced diet and exercise plan. Weight has been stable. No complaints today. She is still breast feeding (80 month old). She is on prenatals.    She has a small lump in her stomach that she would like me to look at. She has had this for 4 years. It has been looked at by her ob/gyn. She thinks it has grown. It is not tender, mobile and not firm. Very superficial.   Immunization History  Administered Date(s) Administered  . Tdap 07/01/2016   Pap smear: utd.  Tdap: 07/01/2016   Review of Systems  Constitutional: Negative for chills, fatigue and unexpected weight change.  HENT: Negative for sinus pain and sore throat.   Respiratory: Negative for cough.   Cardiovascular: Negative.   Gastrointestinal: Negative for abdominal pain, diarrhea and nausea.  Genitourinary: Negative.  Negative for difficulty urinating and dysuria.  Musculoskeletal: Negative for back pain, joint swelling and neck pain.  Neurological: Positive for headaches. Negative for dizziness.  Psychiatric/Behavioral: The patient is not nervous/anxious.     Allergies Patient is allergic to sulfa antibiotics and lorcet 10-650 [hydrocodone-acetaminophen].  Past Medical History Patient  has a past medical history of AMA (advanced maternal age) multigravida 35+, Anxiety, Headache(784.0), Hyperemesis, Missed ab, and PONV (postoperative nausea and vomiting).  Surgical History Patient  has a past surgical history that  includes Knee surgery (1993); Wisdom tooth extraction (98); and Dilation and evacuation (N/A, 09/13/2012).  Family History Pateint's family history includes Cancer in her maternal grandmother; Depression in her father; Diabetes in her father and maternal grandfather; Hypertension in her maternal grandfather; Thyroid disease in her mother.  Social History Patient  reports that she has never smoked. She has never used smokeless tobacco. She reports that she does not drink alcohol or use drugs.    Objective: Vitals:   10/29/17 1447  BP: 114/76  Pulse: 68  Temp: 98.8 F (37.1 C)  SpO2: 98%  Weight: 117 lb 8 oz (53.3 kg)  Height: 5' 5.75" (1.67 m)    Body mass index is 19.11 kg/m.  Physical Exam  Constitutional: She is oriented to person, place, and time. She appears well-developed and well-nourished.  HENT:  Right Ear: External ear normal.  Left Ear: External ear normal.  Mouth/Throat: Oropharynx is clear and moist.  Eyes: Pupils are equal, round, and reactive to light. Conjunctivae and EOM are normal.  Neck: Normal range of motion. Neck supple. No thyromegaly present.  Cardiovascular: Normal rate, regular rhythm, normal heart sounds and intact distal pulses.  No murmur heard. Pulmonary/Chest: Effort normal and breath sounds normal.  Abdominal: Soft. Bowel sounds are normal. She exhibits no distension. There is no tenderness.  1 cm mobile, superficial mass in upper abdomen at T4 level. Non tender to palpation.   Lymphadenopathy:    She has no cervical adenopathy.  Neurological: She is alert and oriented to person, place, and time. She displays normal reflexes. No cranial nerve deficit.  Coordination normal.  Skin: Skin is warm and dry. No rash noted.  Psychiatric: She has a normal mood and affect. Her behavior is normal.  Vitals reviewed.      Assessment/plan: 1. Annual physical exam Very healthy and fit. Continue healthy lifestyle. Will come back for fasting labs. Needs  repeat pap mere in June. F/u in one year or sooner for pap smear.  Patient counseling [x]    Nutrition: Stressed importance of moderation in sodium/caffeine intake, saturated fat and cholesterol, caloric balance, sufficient intake of fresh fruits, vegetables, fiber, calcium, iron, and 1 mg of folate supplement per day (for females capable of pregnancy).  [x]    Stressed the importance of regular exercise.   []    Substance Abuse: Discussed cessation/primary prevention of tobacco, alcohol, or other drug use; driving or other dangerous activities under the influence; availability of treatment for abuse.   [x]    Injury prevention: Discussed safety belts, safety helmets, smoke detector, smoking near bedding or upholstery.   [x]    Sexuality: Discussed sexually transmitted diseases, partner selection, use of condoms, avoidance of unintended pregnancy  and contraceptive alternatives.  [x]    Dental health: Discussed importance of regular tooth brushing, flossing, and dental visits.  [x]    Health maintenance and immunizations reviewed. Please refer to Health maintenance section.    - CBC with Differential/Platelet; Future - Comprehensive metabolic panel; Future - Lipid panel; Future - TSH; Future  2. Lipoma of abdominal wall Very small and superficial. Could even be sebacious cyst. Offered ultrasound, but we are going to continue to watch. If grows, changes, pain, etc. We will image and she will let me know.      Return in about 1 year (around 10/30/2018).     Orma Flaming, MD [place]  10/29/2017

## 2017-11-05 ENCOUNTER — Other Ambulatory Visit: Payer: Self-pay | Admitting: Family Medicine

## 2017-11-05 ENCOUNTER — Other Ambulatory Visit (INDEPENDENT_AMBULATORY_CARE_PROVIDER_SITE_OTHER): Payer: BLUE CROSS/BLUE SHIELD

## 2017-11-05 DIAGNOSIS — Z0000 Encounter for general adult medical examination without abnormal findings: Secondary | ICD-10-CM

## 2017-11-05 DIAGNOSIS — R799 Abnormal finding of blood chemistry, unspecified: Secondary | ICD-10-CM

## 2017-11-05 LAB — COMPREHENSIVE METABOLIC PANEL
ALBUMIN: 4.8 g/dL (ref 3.5–5.2)
ALT: 11 U/L (ref 0–35)
AST: 13 U/L (ref 0–37)
Alkaline Phosphatase: 76 U/L (ref 39–117)
BUN: 30 mg/dL — AB (ref 6–23)
CHLORIDE: 104 meq/L (ref 96–112)
CO2: 31 mEq/L (ref 19–32)
CREATININE: 0.9 mg/dL (ref 0.40–1.20)
Calcium: 9.6 mg/dL (ref 8.4–10.5)
GFR: 74.84 mL/min (ref >60.00)
Glucose, Bld: 81 mg/dL (ref 70–99)
Potassium: 4.3 mEq/L (ref 3.5–5.1)
SODIUM: 143 meq/L (ref 135–145)
TOTAL PROTEIN: 7.3 g/dL (ref 6.0–8.3)
Total Bilirubin: 0.5 mg/dL (ref 0.2–1.2)

## 2017-11-05 LAB — CBC WITH DIFFERENTIAL/PLATELET
BASOS PCT: 0.7 % (ref 0.0–3.0)
Basophils Absolute: 0 10*3/uL (ref 0.0–0.1)
EOS ABS: 0 10*3/uL (ref 0.0–0.7)
Eosinophils Relative: 0.7 % (ref 0.0–5.0)
HCT: 41.2 % (ref 36.0–46.0)
HEMOGLOBIN: 14 g/dL (ref 12.0–15.0)
LYMPHS ABS: 1.9 10*3/uL (ref 0.7–4.0)
Lymphocytes Relative: 33.6 % (ref 12.0–46.0)
MCHC: 34 g/dL (ref 30.0–36.0)
MCV: 92.2 fl (ref 78.0–100.0)
MONO ABS: 0.4 10*3/uL (ref 0.1–1.0)
Monocytes Relative: 7 % (ref 3.0–12.0)
NEUTROS PCT: 58 % (ref 43.0–77.0)
Neutro Abs: 3.3 10*3/uL (ref 1.4–7.7)
PLATELETS: 278 10*3/uL (ref 150.0–400.0)
RBC: 4.46 Mil/uL (ref 3.87–5.11)
RDW: 12.7 % (ref 11.5–15.5)
WBC: 5.7 10*3/uL (ref 4.0–10.5)

## 2017-11-05 LAB — LIPID PANEL
Cholesterol: 163 mg/dL (ref 0–200)
HDL: 65 mg/dL (ref >39.00)
LDL Cholesterol: 91 mg/dL (ref 0–99)
NonHDL: 98.39
Total CHOL/HDL Ratio: 3
Triglycerides: 36 mg/dL (ref 0.0–149.0)
VLDL: 7.2 mg/dL (ref 0.0–40.0)

## 2017-11-05 LAB — TSH: TSH: 1.97 u[IU]/mL (ref 0.35–4.50)

## 2017-11-19 ENCOUNTER — Encounter: Payer: Self-pay | Admitting: Family Medicine

## 2017-12-06 ENCOUNTER — Other Ambulatory Visit (INDEPENDENT_AMBULATORY_CARE_PROVIDER_SITE_OTHER): Payer: BLUE CROSS/BLUE SHIELD

## 2017-12-06 DIAGNOSIS — R799 Abnormal finding of blood chemistry, unspecified: Secondary | ICD-10-CM

## 2017-12-06 LAB — BASIC METABOLIC PANEL
BUN: 20 mg/dL (ref 6–23)
CHLORIDE: 105 meq/L (ref 96–112)
CO2: 31 mEq/L (ref 19–32)
CREATININE: 0.92 mg/dL (ref 0.40–1.20)
Calcium: 9.2 mg/dL (ref 8.4–10.5)
GFR: 72.94 mL/min (ref >60.00)
Glucose, Bld: 87 mg/dL (ref 70–99)
POTASSIUM: 3.9 meq/L (ref 3.5–5.1)
Sodium: 142 mEq/L (ref 135–145)

## 2018-08-17 ENCOUNTER — Ambulatory Visit: Payer: BLUE CROSS/BLUE SHIELD | Admitting: Family Medicine

## 2019-08-31 ENCOUNTER — Encounter: Payer: Self-pay | Admitting: Family Medicine

## 2020-01-25 ENCOUNTER — Other Ambulatory Visit: Payer: Self-pay

## 2020-01-25 ENCOUNTER — Ambulatory Visit (INDEPENDENT_AMBULATORY_CARE_PROVIDER_SITE_OTHER): Payer: PRIVATE HEALTH INSURANCE | Admitting: Family Medicine

## 2020-01-25 ENCOUNTER — Encounter: Payer: Self-pay | Admitting: Family Medicine

## 2020-01-25 VITALS — BP 134/88 | HR 81 | Temp 98.3°F | Ht 64.5 in | Wt 125.8 lb

## 2020-01-25 DIAGNOSIS — R5383 Other fatigue: Secondary | ICD-10-CM

## 2020-01-25 DIAGNOSIS — Z1159 Encounter for screening for other viral diseases: Secondary | ICD-10-CM

## 2020-01-25 DIAGNOSIS — Z0000 Encounter for general adult medical examination without abnormal findings: Secondary | ICD-10-CM

## 2020-01-25 NOTE — Progress Notes (Signed)
Patient: Carla Edwards MRN: [id number] DOB: 04-10-1981 PCP: Orma Flaming, MD     Subjective:  Chief Complaint  Patient presents with  . Annual Exam  . Fatigue    HPI: The patient is a 39 y.o. female who presents today for annual exam. She denies any changes to past medical history. There have been no recent hospitalizations. They are following a well balanced diet and exercise plan. She runs, lifts weights, and is very active with her kids. Weight has been stable. She complains of feeling very fatigue lately, especially the week before her cycle and during. She denies any weight loss, night sweats, fevers, blood in stool/diarrhea, easy bruising/bleeding. She denies any depressive symptoms. Eating and drinking good. She is not sleeping good, but this has been her history for 10 years with babies.   No family hx of first degree with colon or breast cancer.   Immunization History  Administered Date(s) Administered  . PFIZER SARS-COV-2 Vaccination 09/07/2019, 10/02/2019  . Tdap 07/01/2016   Colonoscopy: routine screening 45 years.  Mammogram: next year.  Pap smear: 2018.    Review of Systems  Constitutional: Positive for fatigue. Negative for chills and fever.  HENT: Negative for dental problem, ear pain, hearing loss and trouble swallowing.   Eyes: Negative for visual disturbance.  Respiratory: Negative for cough, chest tightness and shortness of breath.   Cardiovascular: Negative for chest pain, palpitations and leg swelling.  Gastrointestinal: Negative for abdominal pain, blood in stool, diarrhea and nausea.  Endocrine: Negative for cold intolerance, polydipsia, polyphagia and polyuria.  Genitourinary: Negative for dysuria, frequency, hematuria, pelvic pain and urgency.  Musculoskeletal: Negative for arthralgias.  Skin: Negative for rash.  Neurological: Negative for dizziness and headaches.  Psychiatric/Behavioral: Negative for dysphoric mood and sleep disturbance. The  patient is not nervous/anxious.     Allergies Patient is allergic to sulfa antibiotics and lorcet 10-650 [hydrocodone-acetaminophen].  Past Medical History Patient  has a past medical history of AMA (advanced maternal age) multigravida 35+, Anxiety, Headache(784.0), Hyperemesis, Missed ab, and PONV (postoperative nausea and vomiting).  Surgical History Patient  has a past surgical history that includes Knee surgery (1993); Wisdom tooth extraction (98); and Dilation and evacuation (N/A, 09/13/2012).  Family History Pateint's family history includes Cancer in her maternal grandmother; Depression in her father; Diabetes in her father and maternal grandfather; Hypertension in her maternal grandfather; Thyroid disease in her mother.  Social History Patient  reports that she has never smoked. She has never used smokeless tobacco. She reports that she does not drink alcohol and does not use drugs.    Objective: Vitals:   01/25/20 1027  BP: 134/88  Pulse: 81  Temp: 98.3 F (36.8 C)  TempSrc: Temporal  SpO2: 98%  Weight: 125 lb 12.8 oz (57.1 kg)  Height: 5' 4.5" (1.638 m)    Body mass index is 21.26 kg/m.  Physical Exam Vitals reviewed.  Constitutional:      Appearance: Normal appearance. She is well-developed and normal weight.  HENT:     Head: Normocephalic and atraumatic.     Right Ear: Tympanic membrane, ear canal and external ear normal.     Left Ear: Tympanic membrane, ear canal and external ear normal.     Nose: Nose normal.     Mouth/Throat:     Mouth: Mucous membranes are moist.  Eyes:     Extraocular Movements: Extraocular movements intact.     Conjunctiva/sclera: Conjunctivae normal.     Pupils: Pupils are equal, round, and  reactive to light.  Neck:     Thyroid: No thyromegaly.  Cardiovascular:     Rate and Rhythm: Normal rate and regular rhythm.     Pulses: Normal pulses.     Heart sounds: Normal heart sounds. No murmur heard.   Pulmonary:     Effort:  Pulmonary effort is normal.     Breath sounds: Normal breath sounds.  Abdominal:     General: Abdomen is flat. Bowel sounds are normal. There is no distension.     Palpations: Abdomen is soft.     Tenderness: There is no abdominal tenderness.  Musculoskeletal:        General: Normal range of motion.     Cervical back: Normal range of motion and neck supple.  Lymphadenopathy:     Cervical: No cervical adenopathy.  Skin:    General: Skin is warm and dry.     Capillary Refill: Capillary refill takes less than 2 seconds.     Findings: No rash.  Neurological:     General: No focal deficit present.     Mental Status: She is alert and oriented to person, place, and time.     Cranial Nerves: No cranial nerve deficit.     Coordination: Coordination normal.     Deep Tendon Reflexes: Reflexes normal.  Psychiatric:        Mood and Affect: Mood normal.        Behavior: Behavior normal.          Office Visit from 01/25/2020 in [place]  PHQ-2 Total Score 0      Assessment/plan: 1. Annual physical exam Routine labs today along with fatigue labs. She is UTD on HM. Very fit. Continue exercise. F/u in on year or as needed.  Patient counseling [x]    Nutrition: Stressed importance of moderation in sodium/caffeine intake, saturated fat and cholesterol, caloric balance, sufficient intake of fresh fruits, vegetables, fiber, calcium, iron, and 1 mg of folate supplement per day (for females capable of pregnancy).  [x]    Stressed the importance of regular exercise.   []    Substance Abuse: Discussed cessation/primary prevention of tobacco, alcohol, or other drug use; driving or other dangerous activities under the influence; availability of treatment for abuse.   [x]    Injury prevention: Discussed safety belts, safety helmets, smoke detector, smoking near bedding or upholstery.   [x]    Sexuality: Discussed sexually transmitted diseases, partner selection, use of condoms,  avoidance of unintended pregnancy  and contraceptive alternatives.  [x]    Dental health: Discussed importance of regular tooth brushing, flossing, and dental visits.  [x]    Health maintenance and immunizations reviewed. Please refer to Health maintenance section.    - CBC with Differential/Platelet; Future - Comprehensive metabolic panel; Future - Lipid panel; Future  2. Encounter for hepatitis C screening test for low risk patient  - Hepatitis C antibody; Future  3. Other fatigue Checking labs. ? If sleep/stress are contributing factors/pandemic. If labs negative will see about working on sleep hygiene. F/u prn.  - TSH; Future - VITAMIN D 25 Hydroxy (Vit-D Deficiency, Fractures); Future - Vitamin B12; Future   This visit occurred during the SARS-CoV-2 public health emergency.  Safety protocols were in place, including screening questions prior to the visit, additional usage of staff PPE, and extensive cleaning of exam room while observing appropriate contact time as indicated for disinfecting solutions.     Return in about 1 year (around 01/24/2021) for annual or sooner with fatigue .  Orma Flaming, MD [place]  01/25/2020

## 2020-01-25 NOTE — Patient Instructions (Addendum)
Okay, so checking labs on fatigue for you and we will go from there.    Preventive Care 80-39 Years Old, Female Preventive care refers to visits with your health care provider and lifestyle choices that can promote health and wellness. This includes:  A yearly physical exam. This may also be called an annual well check.  Regular dental visits and eye exams.  Immunizations.  Screening for certain conditions.  Healthy lifestyle choices, such as eating a healthy diet, getting regular exercise, not using drugs or products that contain nicotine and tobacco, and limiting alcohol use. What can I expect for my preventive care visit? Physical exam Your health care provider will check your:  Height and weight. This may be used to calculate body mass index (BMI), which tells if you are at a healthy weight.  Heart rate and blood pressure.  Skin for abnormal spots. Counseling Your health care provider may ask you questions about your:  Alcohol, tobacco, and drug use.  Emotional well-being.  Home and relationship well-being.  Sexual activity.  Eating habits.  Work and work Statistician.  Method of birth control.  Menstrual cycle.  Pregnancy history. What immunizations do I need?  Influenza (flu) vaccine  This is recommended every year. Tetanus, diphtheria, and pertussis (Tdap) vaccine  You may need a Td booster every 10 years. Varicella (chickenpox) vaccine  You may need this if you have not been vaccinated. Human papillomavirus (HPV) vaccine  If recommended by your health care provider, you may need three doses over 6 months. Measles, mumps, and rubella (MMR) vaccine  You may need at least one dose of MMR. You may also need a second dose. Meningococcal conjugate (MenACWY) vaccine  One dose is recommended if you are age 61-21 years and a first-year college student living in a residence hall, or if you have one of several medical conditions. You may also need additional  booster doses. Pneumococcal conjugate (PCV13) vaccine  You may need this if you have certain conditions and were not previously vaccinated. Pneumococcal polysaccharide (PPSV23) vaccine  You may need one or two doses if you smoke cigarettes or if you have certain conditions. Hepatitis A vaccine  You may need this if you have certain conditions or if you travel or work in places where you may be exposed to hepatitis A. Hepatitis B vaccine  You may need this if you have certain conditions or if you travel or work in places where you may be exposed to hepatitis B. Haemophilus influenzae type b (Hib) vaccine  You may need this if you have certain conditions. You may receive vaccines as individual doses or as more than one vaccine together in one shot (combination vaccines). Talk with your health care provider about the risks and benefits of combination vaccines. What tests do I need?  Blood tests  Lipid and cholesterol levels. These may be checked every 5 years starting at age 75.  Hepatitis C test.  Hepatitis B test. Screening  Diabetes screening. This is done by checking your blood sugar (glucose) after you have not eaten for a while (fasting).  Sexually transmitted disease (STD) testing.  BRCA-related cancer screening. This may be done if you have a family history of breast, ovarian, tubal, or peritoneal cancers.  Pelvic exam and Pap test. This may be done every 3 years starting at age 80. Starting at age 50, this may be done every 5 years if you have a Pap test in combination with an HPV test. Talk with your  health care provider about your test results, treatment options, and if necessary, the need for more tests. Follow these instructions at home: Eating and drinking   Eat a diet that includes fresh fruits and vegetables, whole grains, lean protein, and low-fat dairy.  Take vitamin and mineral supplements as recommended by your health care provider.  Do not drink alcohol  if: ? Your health care provider tells you not to drink. ? You are pregnant, may be pregnant, or are planning to become pregnant.  If you drink alcohol: ? Limit how much you have to 0-1 drink a day. ? Be aware of how much alcohol is in your drink. In the [place] one drink equals one 12 oz bottle of beer (355 mL), one 5 oz glass of wine (148 mL), or one 1 oz glass of hard liquor (44 mL). Lifestyle  Take daily care of your teeth and gums.  Stay active. Exercise for at least 30 minutes on 5 or more days each week.  Do not use any products that contain nicotine or tobacco, such as cigarettes, e-cigarettes, and chewing tobacco. If you need help quitting, ask your health care provider.  If you are sexually active, practice safe sex. Use a condom or other form of birth control (contraception) in order to prevent pregnancy and STIs (sexually transmitted infections). If you plan to become pregnant, see your health care provider for a preconception visit. What's next?  Visit your health care provider once a year for a well check visit.  Ask your health care provider how often you should have your eyes and teeth checked.  Stay up to date on all vaccines. This information is not intended to replace advice given to you by your health care provider. Make sure you discuss any questions you have with your health care provider. Document Revised: 02/03/2018 Document Reviewed: 02/03/2018 Elsevier Patient Education  2020 [employer].

## 2020-01-26 LAB — CBC WITH DIFFERENTIAL/PLATELET
Absolute Monocytes: 506 cells/uL (ref 200–950)
Basophils Absolute: 39 cells/uL (ref 0–200)
Basophils Relative: 0.7 %
Eosinophils Absolute: 11 cells/uL — ABNORMAL LOW (ref 15–500)
Eosinophils Relative: 0.2 %
HCT: 39.6 % (ref 35.0–45.0)
Hemoglobin: 13.7 g/dL (ref 11.7–15.5)
Lymphs Abs: 1562 cells/uL (ref 850–3900)
MCH: 31.8 pg (ref 27.0–33.0)
MCHC: 34.6 g/dL (ref 32.0–36.0)
MCV: 91.9 fL (ref 80.0–100.0)
MPV: 10.4 fL (ref 7.5–12.5)
Monocytes Relative: 9.2 %
Neutro Abs: 3383 cells/uL (ref 1500–7800)
Neutrophils Relative %: 61.5 %
Platelets: 290 10*3/uL (ref 140–400)
RBC: 4.31 10*6/uL (ref 3.80–5.10)
RDW: 12.3 % (ref 11.0–15.0)
Total Lymphocyte: 28.4 %
WBC: 5.5 10*3/uL (ref 3.8–10.8)

## 2020-01-26 LAB — LIPID PANEL
Cholesterol: 175 mg/dL (ref <200)
HDL: 72 mg/dL (ref >=50)
LDL Cholesterol (Calc): 88 mg/dL (calc)
Non-HDL Cholesterol (Calc): 103 mg/dL (calc) (ref <130)
Total CHOL/HDL Ratio: 2.4 (calc) (ref <5.0)
Triglycerides: 66 mg/dL (ref <150)

## 2020-01-26 LAB — COMPREHENSIVE METABOLIC PANEL
AG Ratio: 2.3 (calc) (ref 1.0–2.5)
ALT: 12 U/L (ref 6–29)
AST: 17 U/L (ref 10–30)
Albumin: 5 g/dL (ref 3.6–5.1)
Alkaline phosphatase (APISO): 46 U/L (ref 31–125)
BUN: 21 mg/dL (ref 7–25)
CO2: 29 mmol/L (ref 20–32)
Calcium: 9.9 mg/dL (ref 8.6–10.2)
Chloride: 103 mmol/L (ref 98–110)
Creat: 0.95 mg/dL (ref 0.50–1.10)
Globulin: 2.2 g/dL (calc) (ref 1.9–3.7)
Glucose, Bld: 85 mg/dL (ref 65–99)
Potassium: 5 mmol/L (ref 3.5–5.3)
Sodium: 138 mmol/L (ref 135–146)
Total Bilirubin: 0.9 mg/dL (ref 0.2–1.2)
Total Protein: 7.2 g/dL (ref 6.1–8.1)

## 2020-01-26 LAB — VITAMIN D 25 HYDROXY (VIT D DEFICIENCY, FRACTURES): Vit D, 25-Hydroxy: 48 ng/mL (ref 30–100)

## 2020-01-26 LAB — HEPATITIS C ANTIBODY
Hepatitis C Ab: NONREACTIVE
SIGNAL TO CUT-OFF: 0.01 (ref <1.00)

## 2020-01-26 LAB — VITAMIN B12: Vitamin B-12: 685 pg/mL (ref 200–1100)

## 2020-01-26 LAB — TSH: TSH: 1.95 mIU/L

## 2020-06-19 LAB — HM PAP SMEAR
HM Pap smear: NORMAL
HPV, high-risk: NEGATIVE

## 2020-06-19 LAB — RESULTS CONSOLE HPV: CHL HPV: NEGATIVE

## 2021-01-13 DIAGNOSIS — M25572 Pain in left ankle and joints of left foot: Secondary | ICD-10-CM | POA: Diagnosis not present

## 2021-04-23 ENCOUNTER — Other Ambulatory Visit (HOSPITAL_BASED_OUTPATIENT_CLINIC_OR_DEPARTMENT_OTHER): Payer: Self-pay

## 2021-04-23 MED ORDER — COVID-19 AT HOME ANTIGEN TEST VI KIT
PACK | 0 refills | Status: DC
  Filled 2021-04-23: qty 2, 4d supply, fill #0

## 2021-04-24 ENCOUNTER — Other Ambulatory Visit (HOSPITAL_BASED_OUTPATIENT_CLINIC_OR_DEPARTMENT_OTHER): Payer: Self-pay

## 2021-07-09 DIAGNOSIS — Z01419 Encounter for gynecological examination (general) (routine) without abnormal findings: Secondary | ICD-10-CM | POA: Diagnosis not present

## 2021-07-09 DIAGNOSIS — Z6821 Body mass index (BMI) 21.0-21.9, adult: Secondary | ICD-10-CM | POA: Diagnosis not present

## 2021-10-22 DIAGNOSIS — Z1231 Encounter for screening mammogram for malignant neoplasm of breast: Secondary | ICD-10-CM | POA: Diagnosis not present

## 2021-10-29 ENCOUNTER — Ambulatory Visit (HOSPITAL_BASED_OUTPATIENT_CLINIC_OR_DEPARTMENT_OTHER): Payer: 59 | Admitting: Nurse Practitioner

## 2021-10-29 ENCOUNTER — Encounter (HOSPITAL_BASED_OUTPATIENT_CLINIC_OR_DEPARTMENT_OTHER): Payer: Self-pay | Admitting: Nurse Practitioner

## 2021-10-29 VITALS — BP 122/82 | HR 82 | Ht 64.0 in | Wt 127.0 lb

## 2021-10-29 DIAGNOSIS — Z13228 Encounter for screening for other metabolic disorders: Secondary | ICD-10-CM

## 2021-10-29 DIAGNOSIS — R5383 Other fatigue: Secondary | ICD-10-CM

## 2021-10-29 DIAGNOSIS — Z0000 Encounter for general adult medical examination without abnormal findings: Secondary | ICD-10-CM | POA: Insufficient documentation

## 2021-10-29 DIAGNOSIS — Z130 Encounter for screening for diseases of the blood and blood-forming organs and certain disorders involving the immune mechanism: Secondary | ICD-10-CM

## 2021-10-29 DIAGNOSIS — Z1329 Encounter for screening for other suspected endocrine disorder: Secondary | ICD-10-CM

## 2021-10-29 DIAGNOSIS — Z1321 Encounter for screening for nutritional disorder: Secondary | ICD-10-CM

## 2021-10-29 NOTE — Patient Instructions (Signed)
Thank you for choosing [facility] for your Primary Care needs. I am excited for the opportunity to partner with you to meet your health care goals. It was a pleasure meeting you today!  Recommendations from today's visit: I will let you know what the labs show.  Keep up the great work! You win the gold star for healthiest patient!!  Information on diet, exercise, and health maintenance recommendations are listed below. This is information to help you be sure you are on track for optimal health and monitoring.   Please look over this and let us know if you have any questions or if you have completed any of the health maintenance outside of [facility] so that we can be sure your records are up to date.  ___________________________________________________________ About Me: I am an Adult-Geriatric Nurse Practitioner with a background in caring for patients for more than 20 years with a strong intensive care background. I provide primary care and sports medicine services to patients age 62 and older within this office. My education had a strong focus on caring for the older adult population, which I am passionate about. I am also the director of the APP Fellowship with [facility].   My desire is to provide you with the best service through preventive medicine and supportive care. I consider you a part of the medical team and value your input. I work diligently to ensure that you are heard and your needs are met in a safe and effective manner. I want you to feel comfortable with me as your provider and want you to know that your health concerns are important to me.  For your information, our office hours are: Monday, Tuesday, and Thursday 8:00 AM - 5:00 PM Wednesday and Friday 8:00 AM - 12:00 PM.   In my time away from the office I am teaching new APP's within the system and am unavailable, but my partner, Dr. Burnard Bunting is in the office for emergent needs.   If you  have questions or concerns, please call our office at [phone] or send [place] a MyChart message and we will respond as quickly as possible.  ____________________________________________________________ MyChart:  For all urgent or time sensitive needs we ask that you please call the office to avoid delays. Our number is [phone]. MyChart is not constantly monitored and due to the large volume of messages a day, replies may take up to 72 business hours.  MyChart Policy: MyChart allows for you to see your visit notes, after visit summary, provider recommendations, lab and tests results, make an appointment, request refills, and contact your provider or the office for non-urgent questions or concerns. Providers are seeing patients during normal business hours and do not have built in time to review MyChart messages.  We ask that you allow a minimum of 3 business days for responses to [employer]. For this reason, please do not send urgent requests through [facility]. Please call the office at [phone]. New and ongoing conditions may require a visit. We have virtual and in person visit available for your convenience.  Complex MyChart concerns may require a visit. Your provider may request you schedule a virtual or in person visit to ensure we are providing the best care possible. MyChart messages sent after 11:00 AM on Friday will not be received by the provider until Monday morning.    Lab and Test Results: You will receive your lab and test results on MyChart as soon as they  are completed and results have been sent by the lab or testing facility. Due to this service, you will receive your results BEFORE your provider.  I review lab and tests results each morning prior to seeing patients. Some results require collaboration with other providers to ensure you are receiving the most appropriate care. For this reason, we ask that you please allow a minimum of 3-5 business days from the time the  ALL results have been received for your provider to receive and review lab and test results and contact you about these.  Most lab and test result comments from the provider will be sent through [place]. Your provider may recommend changes to the plan of care, follow-up visits, repeat testing, ask questions, or request an office visit to discuss these results. You may reply directly to this message or call the office at [phone] to provide information for the provider or set up an appointment. In some instances, you will be called with test results and recommendations. Please let us know if this is preferred and we will make note of this in your chart to provide this for you.    If you have not heard a response to your lab or test results in 5 business days from all results returning to [place], please call the office to let us know. We ask that you please avoid calling prior to this time unless there is an emergent concern. Due to high call volumes, this can delay the resulting process.  After Hours: For all non-emergency after hours needs, please call the office at [phone] and select the option to reach the on-call provider service. On-call services are shared between multiple [facility] offices and therefore it will not be possible to speak directly with your provider. On-call providers may provide medical advice and recommendations, but are unable to provide refills for maintenance medications.  For all emergency or urgent medical needs after normal business hours, we recommend that you seek care at the closest Urgent Care or Emergency Department to ensure appropriate treatment in a timely manner.  [facility] [place] has a 24 hour emergency room located on the ground floor for your convenience.   Urgent Concerns During the Business Day Providers are seeing patients from 8AM to [place] with a busy schedule and are most often not able to respond to non-urgent calls until the end of  the day or the next business day. If you should have URGENT concerns during the day, please call and speak to the nurse or schedule a same day appointment so that we can address your concern without delay.   Thank you, again, for choosing me as your health care partner. I appreciate your trust and look forward to learning more about you.   Worthy Keeler, DNP, AGNP-c ___________________________________________________________  Health Maintenance Recommendations Screening Testing Mammogram Every 1 -2 years based on history and risk factors Starting at age 54 Pap Smear Ages 21-39 every 3 years Ages 48-65 every 5 years with HPV testing More frequent testing may be required based on results and history Colon Cancer Screening Every 1-10 years based on test performed, risk factors, and history Starting at age 29 Bone Density Screening Every 2-10 years based on history Starting at age 69 for women Recommendations for men differ based on medication usage, history, and risk factors AAA Screening One time ultrasound Men 38-70 years old who have every smoked Lung Cancer Screening Low Dose Lung CT every 12 months Age 65-80 years with a 62  pack-year smoking history who still smoke or who have quit within the last 15 years  Screening Labs Routine  Labs: Complete Blood Count (CBC), Complete Metabolic Panel (CMP), Cholesterol (Lipid Panel) Every 6-12 months based on history and medications May be recommended more frequently based on current conditions or previous results Hemoglobin A1c Lab Every 3-12 months based on history and previous results Starting at age 81 or earlier with diagnosis of diabetes, high cholesterol, BMI >26, and/or risk factors Frequent monitoring for patients with diabetes to ensure blood sugar control Thyroid Panel (TSH w/ T3 & T4) Every 6 months based on history, symptoms, and risk factors May be repeated more often if on medication HIV One time testing for all  patients 15 and older May be repeated more frequently for patients with increased risk factors or exposure Hepatitis C One time testing for all patients 50 and older May be repeated more frequently for patients with increased risk factors or exposure Gonorrhea, Chlamydia Every 12 months for all sexually active persons 13-24 years Additional monitoring may be recommended for those who are considered high risk or who have symptoms PSA Men 34-59 years old with risk factors Additional screening may be recommended from age 80-69 based on risk factors, symptoms, and history  Vaccine Recommendations Tetanus Booster All adults every 10 years Flu Vaccine All patients 6 months and older every year COVID Vaccine All patients 12 years and older Initial dosing with booster May recommend additional booster based on age and health history HPV Vaccine 2 doses all patients age 56-26 Dosing may be considered for patients over 26 Shingles Vaccine (Shingrix) 2 doses all adults 47 years and older Pneumonia (Pneumovax 23) All adults 39 years and older May recommend earlier dosing based on health history Pneumonia (Prevnar 10) All adults 62 years and older Dosed 1 year after Pneumovax 23  Additional Screening, Testing, and Vaccinations may be recommended on an individualized basis based on family history, health history, risk factors, and/or exposure.  __________________________________________________________  Diet Recommendations for All Patients  I recommend that all patients maintain a diet low in saturated fats, carbohydrates, and cholesterol. While this can be challenging at first, it is not impossible and small changes can make big differences.  Things to try: Decreasing the amount of soda, sweet tea, and/or juice to one or less per day and replace with water While water is always the first choice, if you do not like water you may consider adding a water additive without sugar to improve the  taste other sugar free drinks Replace potatoes with a brightly colored vegetable at dinner Use healthy oils, such as canola oil or olive oil, instead of butter or hard margarine Limit your bread intake to two pieces or less a day Replace regular pasta with low carb pasta options Bake, broil, or grill foods instead of frying Monitor portion sizes  Eat smaller, more frequent meals throughout the day instead of large meals  An important thing to remember is, if you love foods that are not great for your health, you don't have to give them up completely. Instead, allow these foods to be a reward when you have done well. Allowing yourself to still have special treats every once in a while is a nice way to tell yourself thank you for working hard to keep yourself healthy.   Also remember that every day is a new day. If you have a bad day and "fall off the wagon", you can still climb right back up  and keep moving along on your journey!  We have resources available to help you!  Some websites that may be helpful include: www.http://carter.biz/  Www.VeryWellFit.com _____________________________________________________________  Activity Recommendations for All Patients  I recommend that all adults get at least 20 minutes of moderate physical activity that elevates your heart rate at least 5 days out of the week.  Some examples include: Walking or jogging at a pace that allows you to carry on a conversation Cycling (stationary bike or outdoors) Water aerobics Yoga Weight lifting Dancing If physical limitations prevent you from putting stress on your joints, exercise in a pool or seated in a chair are excellent options.  Do determine your MAXIMUM heart rate for activity: YOUR AGE - 220 = MAX HeartRate   Remember! Do not push yourself too hard.  Start slowly and build up your pace, speed, weight, time in exercise, etc.  Allow your body to rest between exercise and get good sleep. You will need more  water than normal when you are exerting yourself. Do not wait until you are thirsty to drink. Drink with a purpose of getting in at least 8, 8 ounce glasses of water a day plus more depending on how much you exercise and sweat.    If you begin to develop dizziness, chest pain, abdominal pain, jaw pain, shortness of breath, headache, vision changes, lightheadedness, or other concerning symptoms, stop the activity and allow your body to rest. If your symptoms are severe, seek emergency evaluation immediately. If your symptoms are concerning, but not severe, please let us know so that we can recommend further evaluation.

## 2021-10-29 NOTE — Assessment & Plan Note (Signed)
Fatigue of unknown etiology.  This is chronic in nature.  Will obtain labs today for evaluation and monitoring.  Discussed with patient that it is possible that her fatigue could be related to phase of life factors however we will rule out common findings.  No alarm findings on evaluation today.  Discussed with patient to follow-up if symptoms worsen or fail to improve.  We will make changes to plan of care based on findings on labs as necessary.

## 2021-10-29 NOTE — Assessment & Plan Note (Signed)
Benign physical exam today.  Healthy 41 year old female with no alarm symptoms present.  We will plan to monitor labs today and make changes to plan of care as necessary.  Pap smear is up-to-date with gynecology.  We will request records for updating health maintenance.  Mammogram completed earlier this year.

## 2021-10-29 NOTE — Progress Notes (Signed)
Orma Render, DNP, AGNP-c Primary Care & Sports Medicine [address]  [place], [place] [address] [phone]  New patient visit   Patient: Carla Edwards   DOB: [birth year]   41 y.o. Female  MRN: [id number] Visit Date: 10/29/2021  Patient Care Team: Kerby Hockley, Coralee Pesa, NP as PCP - General (Nurse Practitioner) Marylynn Pearson, MD as Consulting Physician (Obstetrics and Gynecology)  Today's Vitals   10/29/21 0911  BP: 122/82  Pulse: 82  SpO2: 99%  Weight: 127 lb (57.6 kg)  Height: $Remove'5\' 4"'kqryZUr$  (1.626 m)   Body mass index is 21.8 kg/m.   Today's healthcare provider: Orma Render, NP   Chief Complaint  Patient presents with   New Patient (Initial Visit)    Patient present today to establish care   Subjective    Carla Edwards is a 41 y.o. female who presents today as a new patient to establish care.    Patient endorses the following concerns presently: CPE  No chronic medical concerns or medications at this time. She is married. She works as a Print production planner. She has 3 children at home. She follows a very healthy diet and lifestyle with routine exercise.  She endorses fatigue however reports that this is chronic and possibly due to working and being the mother. She reports that she sleeps well at night however is awakened frequently by one of the children for various needs. She tells me her mood is stable.  She reports a history of migraines migraines She has certain triggers with caffeine, barometric pressure changes, stressful situations, dehydration These are typically well controlled with OTC medications and avoidance of triggers No concerns today.  Sees GYN for Pap- last one was 2022- [facility] She is up to date on Mammogram  History reviewed and reveals the following: Past Medical History:  Diagnosis Date   AMA (advanced maternal age) multigravida 35+    Anxiety    Headache(784.0)    Hyperemesis    Missed  ab    PONV (postoperative nausea and vomiting)    slow to awaken   Past Surgical History:  Procedure Laterality Date   DILATION AND EVACUATION N/A 09/13/2012   Procedure: DILATATION AND EVACUATION;  Surgeon: Marylynn Pearson, MD;  Location: [facility];  Service: Gynecology;  Laterality: N/A;   KNEE SURGERY  1993   WISDOM TOOTH EXTRACTION  52   Family Status  Relation Name Status   Mother  Alive   Father  Alive   MGM  (Not Specified)   MGF  (Not Specified)   Family History  Problem Relation Age of Onset   Thyroid disease Mother    Depression Father    Diabetes Father    Cancer Maternal Grandmother    Diabetes Maternal Grandfather    Hypertension Maternal Grandfather    Social History   Socioeconomic History   Marital status: Married    Spouse name: Not on file   Number of children: Not on file   Years of education: Not on file   Highest education level: Not on file  Occupational History   Not on file  Tobacco Use   Smoking status: Never   Smokeless tobacco: Never  Substance and Sexual Activity   Alcohol use: No   Drug use: No   Sexual activity: Yes    Birth control/protection: None  Other Topics Concern   Not on file  Social History Narrative   Not on file   Social Determinants  of Health   Financial Resource Strain: Not on file  Food Insecurity: Not on file  Transportation Needs: Not on file  Physical Activity: Not on file  Stress: Not on file  Social Connections: Not on file   Outpatient Medications Prior to Visit  Medication Sig   Multiple Vitamin (MULTIVITAMIN) tablet Take 1 tablet by mouth daily.   [DISCONTINUED] COVID-19 At Home Antigen Test KIT Use as directed. (Patient not taking: Reported on 10/29/2021)   [DISCONTINUED] ibuprofen (ADVIL,MOTRIN) 200 MG tablet Take 200 mg by mouth every 6 (six) hours as needed (occasional for headaches). (Patient not taking: Reported on 01/25/2020)   [DISCONTINUED] Prenatal Vit-Fe Fumarate-FA (PRENATAL MULTIVITAMIN) TABS  tablet Take 1 tablet by mouth daily at 12 noon. (Patient not taking: Reported on 01/25/2020)   No facility-administered medications prior to visit.   Allergies  Allergen Reactions   Sulfa Antibiotics Anaphylaxis    Tongue swelling   Lorcet 10-650 [Hydrocodone-Acetaminophen]     Hydrocodone- Avoid Vicodin   Immunization History  Administered Date(s) Administered   PFIZER(Purple Top)SARS-COV-2 Vaccination 09/07/2019, 10/02/2019   Pfizer Covid-19 Vaccine Bivalent Booster 43yrs & up 04/08/2020, 04/08/2021   Tdap 07/01/2016    Review of Systems All review of systems negative except what is listed in the HPI   Objective    BP 122/82   Pulse 82   Ht 5\' 4"  (1.626 m)   Wt 127 lb (57.6 kg)   SpO2 99%   BMI 21.80 kg/m  Physical Exam Vitals and nursing note reviewed.  Constitutional:      General: She is not in acute distress.    Appearance: Normal appearance.  HENT:     Head: Normocephalic and atraumatic.     Right Ear: Hearing, tympanic membrane, ear canal and external ear normal.     Left Ear: Hearing, tympanic membrane, ear canal and external ear normal.     Nose: Nose normal.     Right Sinus: No maxillary sinus tenderness or frontal sinus tenderness.     Left Sinus: No maxillary sinus tenderness or frontal sinus tenderness.     Mouth/Throat:     Lips: Pink.     Mouth: Mucous membranes are moist.     Pharynx: Oropharynx is clear.  Eyes:     General: Lids are normal. Vision grossly intact.     Extraocular Movements: Extraocular movements intact.     Conjunctiva/sclera: Conjunctivae normal.     Pupils: Pupils are equal, round, and reactive to light.     Funduscopic exam:    Right eye: Red reflex present.        Left eye: Red reflex present.    Visual Fields: Right eye visual fields normal and left eye visual fields normal.  Neck:     Thyroid: No thyromegaly.     Vascular: No carotid bruit.  Cardiovascular:     Rate and Rhythm: Normal rate and regular rhythm.     Chest  Wall: PMI is not displaced.     Pulses: Normal pulses.          Dorsalis pedis pulses are 2+ on the right side and 2+ on the left side.       Posterior tibial pulses are 2+ on the right side and 2+ on the left side.     Heart sounds: Normal heart sounds. No murmur heard. Pulmonary:     Effort: Pulmonary effort is normal. No respiratory distress.     Breath sounds: Normal breath sounds.  Abdominal:  General: Abdomen is flat. Bowel sounds are normal. There is no distension.     Palpations: Abdomen is soft. There is no hepatomegaly, splenomegaly or mass.     Tenderness: There is no abdominal tenderness. There is no right CVA tenderness, left CVA tenderness, guarding or rebound.  Musculoskeletal:        General: Normal range of motion.     Cervical back: Full passive range of motion without pain, normal range of motion and neck supple. No tenderness.     Right lower leg: No edema.     Left lower leg: No edema.  Feet:     Left foot:     Toenail Condition: Left toenails are normal.  Lymphadenopathy:     Cervical: No cervical adenopathy.     Upper Body:     Right upper body: No supraclavicular adenopathy.     Left upper body: No supraclavicular adenopathy.  Skin:    General: Skin is warm and dry.     Capillary Refill: Capillary refill takes less than 2 seconds.     Nails: There is no clubbing.  Neurological:     General: No focal deficit present.     Mental Status: She is alert and oriented to person, place, and time.     GCS: GCS eye subscore is 4. GCS verbal subscore is 5. GCS motor subscore is 6.     Sensory: Sensation is intact.     Motor: Motor function is intact.     Coordination: Coordination is intact.     Gait: Gait is intact.     Deep Tendon Reflexes: Reflexes are normal and symmetric.  Psychiatric:        Attention and Perception: Attention normal.        Mood and Affect: Mood normal.        Speech: Speech normal.        Behavior: Behavior normal. Behavior is  cooperative.        Thought Content: Thought content normal.        Cognition and Memory: Cognition and memory normal.        Judgment: Judgment normal.    No results found for any visits on 10/29/21.  Assessment & Plan      Problem List Items Addressed This Visit     Fatigue - Primary    Fatigue of unknown etiology.  This is chronic in nature.  Will obtain labs today for evaluation and monitoring.  Discussed with patient that it is possible that her fatigue could be related to phase of life factors however we will rule out common findings.  No alarm findings on evaluation today.  Discussed with patient to follow-up if symptoms worsen or fail to improve.  We will make changes to plan of care based on findings on labs as necessary.       Relevant Orders   Comprehensive metabolic panel   CBC With Diff/Platelet   B12 and Folate Panel   Thyroid Panel With TSH   VITAMIN D 25 Hydroxy (Vit-D Deficiency, Fractures)   LDL cholesterol, direct   Encounter for annual physical exam    Benign physical exam today.  Healthy 41 year old female with no alarm symptoms present.  We will plan to monitor labs today and make changes to plan of care as necessary.  Pap smear is up-to-date with gynecology.  We will request records for updating health maintenance.  Mammogram completed earlier this year.       Relevant Orders  Comprehensive metabolic panel   CBC With Diff/Platelet   B12 and Folate Panel   Thyroid Panel With TSH   VITAMIN D 25 Hydroxy (Vit-D Deficiency, Fractures)   LDL cholesterol, direct   Other Visit Diagnoses     Screening for endocrine, nutritional, metabolic and immunity disorder       Relevant Orders   Comprehensive metabolic panel   CBC With Diff/Platelet   B12 and Folate Panel   Thyroid Panel With TSH   VITAMIN D 25 Hydroxy (Vit-D Deficiency, Fractures)   LDL cholesterol, direct        Return in about 1 year (around 10/30/2022) for CPE today- CPE in 1 year.       Sinclaire Artiga, Coralee Pesa, NP, DNP, AGNP-C Primary Care & Sports Medicine at [facility]

## 2021-10-30 LAB — COMPREHENSIVE METABOLIC PANEL
ALT: 11 IU/L (ref 0–32)
AST: 15 IU/L (ref 0–40)
Albumin/Globulin Ratio: 2.3 — ABNORMAL HIGH (ref 1.2–2.2)
Albumin: 4.8 g/dL (ref 3.8–4.8)
Alkaline Phosphatase: 60 IU/L (ref 44–121)
BUN/Creatinine Ratio: 20 (ref 9–23)
BUN: 19 mg/dL (ref 6–24)
Bilirubin Total: 0.5 mg/dL (ref 0.0–1.2)
CO2: 25 mmol/L (ref 20–29)
Calcium: 9.8 mg/dL (ref 8.7–10.2)
Chloride: 101 mmol/L (ref 96–106)
Creatinine, Ser: 0.95 mg/dL (ref 0.57–1.00)
Globulin, Total: 2.1 g/dL (ref 1.5–4.5)
Glucose: 87 mg/dL (ref 70–99)
Potassium: 4.1 mmol/L (ref 3.5–5.2)
Sodium: 139 mmol/L (ref 134–144)
Total Protein: 6.9 g/dL (ref 6.0–8.5)
eGFR: 77 mL/min/{1.73_m2} (ref >59)

## 2021-10-30 LAB — THYROID PANEL WITH TSH
Free Thyroxine Index: 1.6 (ref 1.2–4.9)
T3 Uptake Ratio: 24 % (ref 24–39)
T4, Total: 6.5 ug/dL (ref 4.5–12.0)
TSH: 1.53 u[IU]/mL (ref 0.450–4.500)

## 2021-10-30 LAB — CBC WITH DIFF/PLATELET
Basophils Absolute: 0 10*3/uL (ref 0.0–0.2)
Basos: 1 %
EOS (ABSOLUTE): 0 10*3/uL (ref 0.0–0.4)
Eos: 0 %
Hematocrit: 39.3 % (ref 34.0–46.6)
Hemoglobin: 14.1 g/dL (ref 11.1–15.9)
Immature Grans (Abs): 0 10*3/uL (ref 0.0–0.1)
Immature Granulocytes: 1 %
Lymphocytes Absolute: 1.4 10*3/uL (ref 0.7–3.1)
Lymphs: 22 %
MCH: 32.4 pg (ref 26.6–33.0)
MCHC: 35.9 g/dL — ABNORMAL HIGH (ref 31.5–35.7)
MCV: 90 fL (ref 79–97)
Monocytes Absolute: 0.5 10*3/uL (ref 0.1–0.9)
Monocytes: 8 %
Neutrophils Absolute: 4.6 10*3/uL (ref 1.4–7.0)
Neutrophils: 68 %
Platelets: 296 10*3/uL (ref 150–450)
RBC: 4.35 x10E6/uL (ref 3.77–5.28)
RDW: 12 % (ref 11.7–15.4)
WBC: 6.6 10*3/uL (ref 3.4–10.8)

## 2021-10-30 LAB — B12 AND FOLATE PANEL
Folate: 15.1 ng/mL (ref >3.0)
Vitamin B-12: 607 pg/mL (ref 232–1245)

## 2021-10-30 LAB — LDL CHOLESTEROL, DIRECT: LDL Direct: 99 mg/dL (ref 0–99)

## 2021-10-30 LAB — VITAMIN D 25 HYDROXY (VIT D DEFICIENCY, FRACTURES): Vit D, 25-Hydroxy: 31.8 ng/mL (ref 30.0–100.0)

## 2022-02-26 ENCOUNTER — Other Ambulatory Visit: Payer: Self-pay

## 2022-06-26 DIAGNOSIS — N930 Postcoital and contact bleeding: Secondary | ICD-10-CM | POA: Diagnosis not present

## 2022-06-26 DIAGNOSIS — N760 Acute vaginitis: Secondary | ICD-10-CM | POA: Diagnosis not present

## 2022-08-25 DIAGNOSIS — J020 Streptococcal pharyngitis: Secondary | ICD-10-CM | POA: Diagnosis not present

## 2022-11-03 ENCOUNTER — Encounter (HOSPITAL_BASED_OUTPATIENT_CLINIC_OR_DEPARTMENT_OTHER): Payer: 59 | Admitting: Nurse Practitioner

## 2022-11-05 DIAGNOSIS — D2261 Melanocytic nevi of right upper limb, including shoulder: Secondary | ICD-10-CM | POA: Diagnosis not present

## 2022-11-05 DIAGNOSIS — L578 Other skin changes due to chronic exposure to nonionizing radiation: Secondary | ICD-10-CM | POA: Diagnosis not present

## 2022-11-05 DIAGNOSIS — D224 Melanocytic nevi of scalp and neck: Secondary | ICD-10-CM | POA: Diagnosis not present

## 2022-11-05 DIAGNOSIS — D225 Melanocytic nevi of trunk: Secondary | ICD-10-CM | POA: Diagnosis not present

## 2022-11-05 DIAGNOSIS — D2271 Melanocytic nevi of right lower limb, including hip: Secondary | ICD-10-CM | POA: Diagnosis not present

## 2022-11-05 DIAGNOSIS — Z86018 Personal history of other benign neoplasm: Secondary | ICD-10-CM | POA: Diagnosis not present

## 2022-12-18 ENCOUNTER — Ambulatory Visit: Payer: Commercial Managed Care - PPO | Admitting: Nurse Practitioner

## 2022-12-18 ENCOUNTER — Encounter: Payer: Self-pay | Admitting: Nurse Practitioner

## 2022-12-18 VITALS — BP 118/78 | HR 70 | Wt 127.2 lb

## 2022-12-18 DIAGNOSIS — B0229 Other postherpetic nervous system involvement: Secondary | ICD-10-CM | POA: Diagnosis not present

## 2022-12-18 MED ORDER — VALACYCLOVIR HCL 1 G PO TABS: 1000.0000 mg | ORAL_TABLET | Freq: Three times a day (TID) | ORAL | 0 refills | Status: AC

## 2022-12-18 MED ORDER — GABAPENTIN 300 MG PO CAPS: ORAL_CAPSULE | ORAL | 0 refills | Status: DC

## 2022-12-18 NOTE — Patient Instructions (Signed)

## 2022-12-18 NOTE — Progress Notes (Signed)
  Tollie Eth, DNP, AGNP-c [facility] Medicine [address] [place], [place] [address] [phone]   ACUTE VISIT- ESTABLISHED PATIENT  Blood pressure 118/78, pulse 70, weight 127 lb 3.2 oz (57.7 kg).  Subjective:  HPI Carla Edwards is a 42 y.o. female presents to day for evaluation of: rash.  Constantina reports first noticing an erythematous spot on the upper chest on Tuesday with rapid progression spreading in a linear fashion with vesicular lesions onto the right arm and upper chest area. She tells me she has been under more stress recently. She did have the chicken pox as a child. She has not been vaccinated against shingles.    PMH, Medications, and Allergies reviewed and updated in chart as appropriate.   ROS negative except for what is listed in HPI. Objective:  Physical Exam Constitutional:      General: She is not in acute distress.    Appearance: Normal appearance. She is not ill-appearing or toxic-appearing.  Lymphadenopathy:     Cervical: No cervical adenopathy.  Skin:    General: Skin is warm and dry.     Findings: Erythema and rash present.     Comments: Linear vessicular rash on erythematous base on right side of chest wall with lesions extending to the right arm. No signs of bacterial infection present.   Neurological:     Mental Status: She is alert.  Psychiatric:        Behavior: Behavior normal.         Assessment & Plan:   Problem List Items Addressed This Visit     Other postherpetic nervous system involvement - Primary    Symptoms and presentation consistent with herpes zoster infection. Recommendations for medication, pain management, monitoring, and safety discussed.  Plan: - Start valacyclovir immediately and complete the entire prescription for full treatment - Take gabapentin for pain. This will make you sleepy, but you can take at night and try tylenol or ibuprofen during the day, if needed.  - If the area begins to look  infected, please let me know immediately.       Relevant Medications   gabapentin (NEURONTIN) 300 MG capsule     Tollie Eth, DNP, AGNP-c   History, Medications, Surgery, SDOH, and Family History reviewed and updated as appropriate.

## 2022-12-20 ENCOUNTER — Telehealth: Payer: Commercial Managed Care - PPO | Admitting: Physician Assistant

## 2022-12-20 ENCOUNTER — Encounter: Payer: Self-pay | Admitting: Nurse Practitioner

## 2022-12-20 DIAGNOSIS — B029 Zoster without complications: Secondary | ICD-10-CM | POA: Diagnosis not present

## 2022-12-20 NOTE — Patient Instructions (Signed)
  Ayesha Mohair, thank you for joining Tylene Fantasia Ward, PA-C for today's virtual visit.  While this provider is not your primary care provider (PCP), if your PCP is located in our provider database this encounter information will be shared with them immediately following your visit.   A [facility] MyChart account gives you access to today's visit and all your visits, tests, and labs performed at [facility] " click here if you don't have a [facility] MyChart account or go to [email or web address]  Consent: (Patient) Carla Edwards provided verbal consent for this virtual visit at the beginning of the encounter.  Current Medications:  Current Outpatient Medications:    gabapentin (NEURONTIN) 300 MG capsule, Take 1 capsule up to three times a day for pain associated with shingles., Disp: 30 capsule, Rfl: 0   Multiple Vitamin (MULTIVITAMIN) tablet, Take 1 tablet by mouth daily., Disp: , Rfl:    valACYclovir (VALTREX) 1000 MG tablet, Take 1 tablet (1,000 mg total) by mouth 3 (three) times daily for 7 days., Disp: 21 tablet, Rfl: 0   Medications ordered in this encounter:  No orders of the defined types were placed in this encounter.    *If you need refills on other medications prior to your next appointment, please contact your pharmacy*  Follow-Up: Call back or seek an in-person evaluation if the symptoms worsen or if the condition fails to improve as anticipated.  [facility] [phone]  Other Instructions Continue with Valacylovir and gabapentin.  Can take Ibuprofen as needed.  Follow up with PCP mid week if no improvement.    If you have been instructed to have an in-person evaluation today at a local Urgent Care facility, please use the link below. It will take you to a list of all of our available [facility], including address, phone number and hours of operation. Please do not delay care.  [facility]  If you or a  family member do not have a primary care provider, use the link below to schedule a visit and establish care. When you choose a [facility] primary care physician or advanced practice provider, you gain a long-term partner in health. Find a Primary Care Provider  Learn more about [facility]'s in-office and virtual care options: [facility] - Get Care Now

## 2022-12-20 NOTE — Progress Notes (Signed)
Virtual Visit Consent   Carla Edwards, you are scheduled for a virtual visit with a [facility] provider today. Just as with appointments in the office, your consent must be obtained to participate. Your consent will be active for this visit and any virtual visit you may have with one of our providers in the next 365 days. If you have a MyChart account, a copy of this consent can be sent to you electronically.  As this is a virtual visit, video technology does not allow for your provider to perform a traditional examination. This may limit your provider's ability to fully assess your condition. If your provider identifies any concerns that need to be evaluated in person or the need to arrange testing (such as labs, EKG, etc.), we will make arrangements to do so. Although advances in technology are sophisticated, we cannot ensure that it will always work on either your end or our end. If the connection with a video visit is poor, the visit may have to be switched to a telephone visit. With either a video or telephone visit, we are not always able to ensure that we have a secure connection.  By engaging in this virtual visit, you consent to the provision of healthcare and authorize for your insurance to be billed (if applicable) for the services provided during this visit. Depending on your insurance coverage, you may receive a charge related to this service.  I need to obtain your verbal consent now. Are you willing to proceed with your visit today? Carla Edwards has provided verbal consent on 12/20/2022 for a virtual visit (video or telephone). Tylene Fantasia Ward, PA-C  Date: 12/20/2022 3:14 PM  Virtual Visit via Video Note   I, Tylene Fantasia Ward, connected with  Carla Edwards  ([id number], 1980/08/05) on 12/20/22 at  2:30 PM EDT by a video-enabled telemedicine application and verified that I am speaking with the correct person using two identifiers.  Location: Patient: Virtual Visit Location  Patient: Home Provider: Virtual Visit Location Provider: Home   I discussed the limitations of evaluation and management by telemedicine and the availability of in person appointments. The patient expressed understanding and agreed to proceed.    History of Present Illness: Carla Edwards is a 42 y.o. who identifies as a female who was assigned female at birth, and is being seen today for continued pain associated with shingles.  Reports she was seen Friday and prescribed valtrex and gabapentin.  She reports since then she has noticed more vesicles.  She has taken gabapentin with minimal relief.  She has take ibuprofen with some improvement.  Rash is still in a dermatomal pattern. Marland Kitchen  HPI: HPI  Problems:  Patient Active Problem List   Diagnosis Date Noted   Fatigue 10/29/2021   Encounter for annual physical exam 10/29/2021    Allergies:  Allergies  Allergen Reactions   Sulfa Antibiotics Anaphylaxis    Tongue swelling   Lorcet 10-650 [Hydrocodone-Acetaminophen]     Hydrocodone- Avoid Vicodin   Medications:  Current Outpatient Medications:    gabapentin (NEURONTIN) 300 MG capsule, Take 1 capsule up to three times a day for pain associated with shingles., Disp: 30 capsule, Rfl: 0   Multiple Vitamin (MULTIVITAMIN) tablet, Take 1 tablet by mouth daily., Disp: , Rfl:    valACYclovir (VALTREX) 1000 MG tablet, Take 1 tablet (1,000 mg total) by mouth 3 (three) times daily for 7 days., Disp: 21 tablet, Rfl: 0  Observations/Objective: Patient is well-developed, well-nourished in  no acute distress.  Resting comfortably at home.  Head is normocephalic, atraumatic.  No labored breathing.  Speech is clear and coherent with logical content.  Patient is alert and oriented at baseline.    Assessment and Plan: 1. Herpes zoster without complication  Advised to continue valtrex and gabapentin.  She can take ibuprofen.  At this time appears to be typical course of shingles.  Advised in person  evaluation with pcp if no improvement in a few days.  No eye involvement, no rash on face .  Follow Up Instructions: I discussed the assessment and treatment plan with the patient. The patient was provided an opportunity to ask questions and all were answered. The patient agreed with the plan and demonstrated an understanding of the instructions.  A copy of instructions were sent to the patient via MyChart unless otherwise noted below.     The patient was advised to call back or seek an in-person evaluation if the symptoms worsen or if the condition fails to improve as anticipated.  Time:  I spent 12 minutes with the patient via telehealth technology discussing the above problems/concerns.    Tylene Fantasia Ward, PA-C

## 2022-12-31 DIAGNOSIS — Z01419 Encounter for gynecological examination (general) (routine) without abnormal findings: Secondary | ICD-10-CM | POA: Diagnosis not present

## 2022-12-31 DIAGNOSIS — Z6821 Body mass index (BMI) 21.0-21.9, adult: Secondary | ICD-10-CM | POA: Diagnosis not present

## 2022-12-31 DIAGNOSIS — Z1231 Encounter for screening mammogram for malignant neoplasm of breast: Secondary | ICD-10-CM | POA: Diagnosis not present

## 2023-01-04 ENCOUNTER — Other Ambulatory Visit: Payer: Self-pay | Admitting: Obstetrics and Gynecology

## 2023-01-04 DIAGNOSIS — R928 Other abnormal and inconclusive findings on diagnostic imaging of breast: Secondary | ICD-10-CM

## 2023-01-08 ENCOUNTER — Ambulatory Visit
Admission: RE | Admit: 2023-01-08 | Discharge: 2023-01-08 | Disposition: A | Discharge status: Home / Self Care | Payer: Commercial Managed Care - PPO | Source: Ambulatory Visit | Attending: Obstetrics and Gynecology | Admitting: Obstetrics and Gynecology

## 2023-01-08 DIAGNOSIS — R928 Other abnormal and inconclusive findings on diagnostic imaging of breast: Secondary | ICD-10-CM

## 2023-01-08 DIAGNOSIS — N6459 Other signs and symptoms in breast: Secondary | ICD-10-CM | POA: Diagnosis not present

## 2023-01-20 DIAGNOSIS — B0229 Other postherpetic nervous system involvement: Secondary | ICD-10-CM | POA: Insufficient documentation

## 2023-01-20 NOTE — Assessment & Plan Note (Signed)
Symptoms and presentation consistent with herpes zoster infection. Recommendations for medication, pain management, monitoring, and safety discussed.  Plan: - Start valacyclovir immediately and complete the entire prescription for full treatment - Take gabapentin for pain. This will make you sleepy, but you can take at night and try tylenol or ibuprofen during the day, if needed.  - If the area begins to look infected, please let me know immediately.

## 2023-02-02 ENCOUNTER — Ambulatory Visit: Payer: Commercial Managed Care - PPO | Admitting: Nurse Practitioner

## 2023-02-02 ENCOUNTER — Encounter: Payer: Self-pay | Admitting: Nurse Practitioner

## 2023-02-02 VITALS — BP 120/78 | HR 62 | Ht 64.5 in | Wt 124.8 lb

## 2023-02-02 DIAGNOSIS — Z0000 Encounter for general adult medical examination without abnormal findings: Secondary | ICD-10-CM

## 2023-02-02 DIAGNOSIS — S82841A Displaced bimalleolar fracture of right lower leg, initial encounter for closed fracture: Secondary | ICD-10-CM | POA: Diagnosis not present

## 2023-02-02 NOTE — Progress Notes (Signed)
[name], DNP, AGNP-c [facility] [address] [place], [place] [address] Main Office [phone]  BP 120/78   Pulse 62   Ht 5' 4.5" (1.638 m)   Wt 124 lb 12.8 oz (56.6 kg)   LMP 01/25/2023   BMI 21.09 kg/m    Subjective:    Patient ID: Carla Edwards, female    DOB: Aug 30, 1980, 42 y.o.   MRN: [id number]  HPI: Carla Edwards is a 42 y.o. female presenting on 02/02/2023 for comprehensive medical examination.   Current medical concerns include:none   A comprehensive review of systems was negative.  IMMUNIZATIONS:   Flu: Flu vaccine postponed until flu season Prevnar 13: Prevnar 13 N/A for this patient Prevnar 20: Prevnar 20 N/A for this patient Pneumovax 23: Pneumovax 23 N/A for this patient Vac Shingrix: Shingrix N/A for this patient HPV: HPV N/A for this patient Tetanus: Tetanus completed in the last 10 years COVID: COVID completed, documentation in chart   HEALTH MAINTENANCE: Pap Smear HM Status: is up to date Mammogram HM Status: is up to date Colon Cancer Screening HM Status: is not applicable for this patient Bone Density HM Status: is not applicable for this patient STI Testing HM Status: is not applicable for this patient Lung CT HM Status: is not applicable for this patient  She denies concerns with vision, hearing, or dentition.   She exercises 6 days a week for at least 60 minutes a session. She eats a healthy balanced diet.    Most Recent Depression Screen:     02/02/2023    8:21 AM 10/29/2021    9:10 AM 01/25/2020   10:32 AM 10/29/2017    2:43 PM  Depression screen PHQ 2/9  Decreased Interest 0 0 0 0  Down, Depressed, Hopeless 0 0 0 0  PHQ - 2 Score 0 0 0 0  Altered sleeping  0    Tired, decreased energy  1    Change in appetite  0    Feeling bad or failure about yourself   0    Trouble concentrating  0    Suicidal thoughts  0    PHQ-9 Score  1    Difficult doing work/chores  Not difficult at all     Most Recent  Anxiety Screen:      No data to display         Most Recent Fall Screen:    02/02/2023    8:21 AM 10/29/2021    9:09 AM 01/25/2020   10:33 AM  Fall Risk   Falls in the past year? 0 0 0  Number falls in past yr: 0 0   Injury with Fall? 0 0   Risk for fall due to : No Fall Risks No Fall Risks No Fall Risks  Follow up Falls evaluation completed Falls evaluation completed;Education provided     Past medical history, surgical history, medications, allergies, family history and social history reviewed with patient today and changes made to appropriate areas of the chart.  Past Medical History:  Past Medical History:  Diagnosis Date   AMA (advanced maternal age) multigravida 35+    Anxiety    Headache(784.0)    Hyperemesis    Missed ab    PONV (postoperative nausea and vomiting)    slow to awaken   Medications:  Current Outpatient Medications on File Prior to Visit  Medication Sig   Multiple Vitamin (MULTIVITAMIN) tablet Take 1 tablet by mouth daily.   No current facility-administered  medications on file prior to visit.   Surgical History:  Past Surgical History:  Procedure Laterality Date   DILATION AND EVACUATION N/A 09/13/2012   Procedure: DILATATION AND EVACUATION;  Surgeon: Zelphia Cairo, MD;  Location: [facility];  Service: Gynecology;  Laterality: N/A;   KNEE SURGERY  1993   WISDOM TOOTH EXTRACTION  89   Allergies:  Allergies  Allergen Reactions   Sulfa Antibiotics Anaphylaxis    Tongue swelling   Lorcet 10-650 [Hydrocodone-Acetaminophen]     Hydrocodone- Avoid Vicodin   Family History:  Family History  Problem Relation Age of Onset   Thyroid disease Mother    Depression Father    Diabetes Father    Cancer Maternal Grandmother    Diabetes Maternal Grandfather    Hypertension Maternal Grandfather        Objective:    BP 120/78   Pulse 62   Ht 5' 4.5" (1.638 m)   Wt 124 lb 12.8 oz (56.6 kg)   LMP 01/25/2023   BMI 21.09 kg/m   Wt Readings from Last 3  Encounters:  02/02/23 124 lb 12.8 oz (56.6 kg)  12/18/22 127 lb 3.2 oz (57.7 kg)  10/29/21 127 lb (57.6 kg)    Physical Exam Vitals and nursing note reviewed.  Constitutional:      General: She is not in acute distress.    Appearance: Normal appearance.  HENT:     Head: Normocephalic and atraumatic.     Right Ear: Hearing, tympanic membrane, ear canal and external ear normal.     Left Ear: Hearing, tympanic membrane, ear canal and external ear normal.     Nose: Nose normal.     Right Sinus: No maxillary sinus tenderness or frontal sinus tenderness.     Left Sinus: No maxillary sinus tenderness or frontal sinus tenderness.     Mouth/Throat:     Lips: Pink.     Mouth: Mucous membranes are moist.     Pharynx: Oropharynx is clear.  Eyes:     General: Lids are normal. Vision grossly intact.     Extraocular Movements: Extraocular movements intact.     Conjunctiva/sclera: Conjunctivae normal.     Pupils: Pupils are equal, round, and reactive to light.     Funduscopic exam:    Right eye: Red reflex present.        Left eye: Red reflex present.    Visual Fields: Right eye visual fields normal and left eye visual fields normal.  Neck:     Thyroid: No thyromegaly.     Vascular: No carotid bruit.  Cardiovascular:     Rate and Rhythm: Normal rate and regular rhythm.     Chest Wall: PMI is not displaced.     Pulses: Normal pulses.          Dorsalis pedis pulses are 2+ on the right side and 2+ on the left side.       Posterior tibial pulses are 2+ on the right side and 2+ on the left side.     Heart sounds: Normal heart sounds. No murmur heard. Pulmonary:     Effort: Pulmonary effort is normal. No respiratory distress.     Breath sounds: Normal breath sounds.  Abdominal:     General: Abdomen is flat. Bowel sounds are normal. There is no distension.     Palpations: Abdomen is soft. There is no hepatomegaly, splenomegaly or mass.     Tenderness: There is no abdominal tenderness. There is  no right  CVA tenderness, left CVA tenderness, guarding or rebound.  Musculoskeletal:        General: Normal range of motion.     Cervical back: Full passive range of motion without pain, normal range of motion and neck supple. No tenderness.     Right lower leg: No edema.     Left lower leg: No edema.  Feet:     Left foot:     Toenail Condition: Left toenails are normal.  Lymphadenopathy:     Cervical: No cervical adenopathy.     Upper Body:     Right upper body: No supraclavicular adenopathy.     Left upper body: No supraclavicular adenopathy.  Skin:    General: Skin is warm and dry.     Capillary Refill: Capillary refill takes less than 2 seconds.     Nails: There is no clubbing.  Neurological:     General: No focal deficit present.     Mental Status: She is alert and oriented to person, place, and time.     GCS: GCS eye subscore is 4. GCS verbal subscore is 5. GCS motor subscore is 6.     Sensory: Sensation is intact.     Motor: Motor function is intact.     Coordination: Coordination is intact.     Gait: Gait is intact.     Deep Tendon Reflexes: Reflexes are normal and symmetric.  Psychiatric:        Attention and Perception: Attention normal.        Mood and Affect: Mood normal.        Speech: Speech normal.        Behavior: Behavior normal. Behavior is cooperative.        Thought Content: Thought content normal.        Cognition and Memory: Cognition and memory normal.        Judgment: Judgment normal.     Results for orders placed or performed in visit on 02/02/23  CBC with Differential/Platelet  Result Value Ref Range   WBC 5.2 3.4 - 10.8 x10E3/uL   RBC 4.34 3.77 - 5.28 x10E6/uL   Hemoglobin 13.4 11.1 - 15.9 g/dL   Hematocrit 98.1 19.1 - 46.6 %   MCV 92 79 - 97 fL   MCH 30.9 26.6 - 33.0 pg   MCHC 33.8 31.5 - 35.7 g/dL   RDW 47.8 29.5 - 62.1 %   Platelets 305 150 - 450 x10E3/uL   Neutrophils 70 Not Estab. %   Lymphs 22 Not Estab. %   Monocytes 7 Not Estab. %    Eos 0 Not Estab. %   Basos 1 Not Estab. %   Neutrophils Absolute 3.6 1.4 - 7.0 x10E3/uL   Lymphocytes Absolute 1.1 0.7 - 3.1 x10E3/uL   Monocytes Absolute 0.4 0.1 - 0.9 x10E3/uL   EOS (ABSOLUTE) 0.0 0.0 - 0.4 x10E3/uL   Basophils Absolute 0.0 0.0 - 0.2 x10E3/uL   Immature Granulocytes 0 Not Estab. %   Immature Grans (Abs) 0.0 0.0 - 0.1 x10E3/uL  CMP14+EGFR  Result Value Ref Range   Glucose 84 70 - 99 mg/dL   BUN 18 6 - 24 mg/dL   Creatinine, Ser 3.08 0.57 - 1.00 mg/dL   eGFR 79 >65 HQ/ION/6.29   BUN/Creatinine Ratio 19 9 - 23   Sodium 139 134 - 144 mmol/L   Potassium 4.8 3.5 - 5.2 mmol/L   Chloride 101 96 - 106 mmol/L   CO2 26 20 - 29 mmol/L  Calcium 9.7 8.7 - 10.2 mg/dL   Total Protein 6.8 6.0 - 8.5 g/dL   Albumin 4.7 3.9 - 4.9 g/dL   Globulin, Total 2.1 1.5 - 4.5 g/dL   Bilirubin Total 0.8 0.0 - 1.2 mg/dL   Alkaline Phosphatase 58 44 - 121 IU/L   AST 16 0 - 40 IU/L   ALT 13 0 - 32 IU/L  Lipid panel  Result Value Ref Range   Cholesterol, Total 189 100 - 199 mg/dL   Triglycerides 69 0 - 149 mg/dL   HDL 73 >19 mg/dL   VLDL Cholesterol Cal 13 5 - 40 mg/dL   LDL Chol Calc (NIH) 147 (H) 0 - 99 mg/dL   Chol/HDL Ratio 2.6 0.0 - 4.4 ratio  QuantiFERON-TB Gold Plus  Result Value Ref Range   QuantiFERON Incubation WILL FOLLOW    QuantiFERON Criteria WILL FOLLOW    QuantiFERON TB1 Ag Value WILL FOLLOW    QuantiFERON TB2 Ag Value WILL FOLLOW    QuantiFERON Nil Value WILL FOLLOW    QuantiFERON Mitogen Value WILL FOLLOW    QuantiFERON-TB Gold Plus WILL FOLLOW   TSH  Result Value Ref Range   TSH 1.630 0.450 - 4.500 uIU/mL         Assessment & Plan:   Problem List Items Addressed This Visit     Encounter for annual physical exam - Primary    CPE completed today. Review of HM activities and recommendations discussed and provided on AVS. Anticipatory guidance, diet, and exercise recommendations provided. Medications, allergies, and hx reviewed and updated as necessary.  Orders placed as listed below.  Plan: - Labs ordered. Will make changes as necessary based on results.  - I will review these results and send recommendations via MyChart or a telephone call.  - F/U with CPE in 1 year or sooner for acute/chronic health needs as directed.        Relevant Orders   CBC with Differential/Platelet (Completed)   CMP14+EGFR (Completed)   Lipid panel (Completed)   QuantiFERON-TB Gold Plus (Completed)   Other Visit Diagnoses     Health care maintenance       Relevant Orders   QuantiFERON-TB Gold Plus (Completed)   TSH (Completed)          Follow up plan: Return in about 1 year (around 02/02/2024) for CPE.  NEXT PREVENTATIVE PHYSICAL DUE IN 1 YEAR.  PATIENT COUNSELING PROVIDED FOR ALL ADULT PATIENTS: A well balanced diet low in saturated fats, cholesterol, and moderation in carbohydrates.  This can be as simple as monitoring portion sizes and cutting back on sugary beverages such as soda and juice to start with.    Daily water consumption of at least 64 ounces.  Physical activity at least 180 minutes per week.  If just starting out, start 10 minutes a day and work your way up.   This can be as simple as taking the stairs instead of the elevator and walking 2-3 laps around the office  purposefully every day.   STD protection, partner selection, and regular testing if high risk.  Limited consumption of alcoholic beverages if alcohol is consumed. For men, I recommend no more than 14 alcoholic beverages per week, spread out throughout the week (max 2 per day). Avoid "binge" drinking or consuming large quantities of alcohol in one setting.  Please let me know if you feel you may need help with reduction or quitting alcohol consumption.   Avoidance of nicotine, if used. Please let me  know if you feel you may need help with reduction or quitting nicotine use.   Daily mental health attention. This can be in the form of 5 minute daily meditation,  prayer, journaling, yoga, reflection, etc.  Purposeful attention to your emotions and mental state can significantly improve your overall wellbeing  and  Health.  Please know that I am here to help you with all of your health care goals and am happy to work with you to find a solution that works best for you.  The greatest advice I have received with any changes in life are to take it one step at a time, that even means if all you can focus on is the next 60 seconds, then do that and celebrate your victories.  With any changes in life, you will have set backs, and that is OK. The important thing to remember is, if you have a set back, it is not a failure, it is an opportunity to try again! Screening Testing Mammogram Every 1 -2 years based on history and risk factors Starting at age 48 Pap Smear Ages 21-39 every 3 years Ages 82-65 every 5 years with HPV testing More frequent testing may be required based on results and history Colon Cancer Screening Every 1-10 years based on test performed, risk factors, and history Starting at age 71 Bone Density Screening Every 2-10 years based on history Starting at age 24 for women Recommendations for men differ based on medication usage, history, and risk factors AAA Screening One time ultrasound Men 79-56 years old who have every smoked Lung Cancer Screening Low Dose Lung CT every 12 months Age 47-80 years with a 30 pack-year smoking history who still smoke or who have quit within the last 15 years   Screening Labs Routine  Labs: Complete Blood Count (CBC), Complete Metabolic Panel (CMP), Cholesterol (Lipid Panel) Every 6-12 months based on history and medications May be recommended more frequently based on current conditions or previous results Hemoglobin A1c Lab Every 3-12 months based on history and previous results Starting at age 21 or earlier with diagnosis of diabetes, high cholesterol, BMI >26, and/or risk factors Frequent monitoring  for patients with diabetes to ensure blood sugar control Thyroid Panel (TSH) Every 6 months based on history, symptoms, and risk factors May be repeated more often if on medication HIV One time testing for all patients 37 and older May be repeated more frequently for patients with increased risk factors or exposure Hepatitis C One time testing for all patients 22 and older May be repeated more frequently for patients with increased risk factors or exposure Gonorrhea, Chlamydia Every 12 months for all sexually active persons 13-24 years Additional monitoring may be recommended for those who are considered high risk or who have symptoms Every 12 months for any woman on birth control, regardless of sexual activity PSA Men 69-47 years old with risk factors Additional screening may be recommended from age 51-69 based on risk factors, symptoms, and history  Vaccine Recommendations Tetanus Booster All adults every 10 years Flu Vaccine All patients 6 months and older every year COVID Vaccine All patients 12 years and older Initial dosing with booster May recommend additional booster based on age and health history HPV Vaccine 2 doses all patients age 88-26 Dosing may be considered for patients over 26 Shingles Vaccine (Shingrix) 2 doses all adults 55 years and older Pneumonia (Pneumovax 23) All adults 65 years and older May recommend earlier dosing based on health history One  year apart from Prevnar 13 Pneumonia (Prevnar 13) All adults 65 years and older Dosed 1 year after Pneumovax 23 Pneumonia (Prevnar 20) One time alternative to the two dosing of 13 and 23 For all adults with initial dose of 23, 20 is recommended 1 year later For all adults with initial dose of 13, 23 is still recommended as second option 1 year later

## 2023-02-02 NOTE — Patient Instructions (Addendum)
 Flu season is approaching. I  suggest receiving the flu vaccine by the end of October to ensure that your body has time to respond to the vaccine before our active flu season begins.  Flu vaccines are available now.   Vaccines may be received at any visit or a nurse visit can be scheduled at your convenience. Flu vaccines may also be obtained at your preferred pharmacy.  *If you have your flu vaccine completed at your pharmacy, please ask they fax the record to [phone] so we can update your record.   A new COVID vaccine is expected to be released in September. This will help cover the new variants of the COVID-19 virus and is recommended.   ** COVID is on the rise again in our area. Please use caution when in large groups. If you begin feeling ill with upper respiratory symptoms, I strongly encourage COVID testing. If your initial test is negative, but symptoms persist, second testing is recommended 2-3 days after the first test.   We offer testing by appointment as a drive up service for our patients. If you have symptoms and would like to have testing, please call the office and arrange for this. We can follow-up with a virtual visit with one of our providers for management.     For all adult patients, I recommend A well balanced diet low in saturated fats, cholesterol, and moderation in carbohydrates.   This can be as simple as monitoring portion sizes and cutting back on sugary beverages such as soda and juice to start with.    Daily water consumption of at least 64 ounces.  Physical activity at least 180 minutes per week, if just starting out.   This can be as simple as taking the stairs instead of the elevator and walking 2-3 laps around the office  purposefully every day.   STD protection, partner selection, and regular testing if high risk.  Limited consumption of alcoholic beverages if alcohol is consumed.  For women, I recommend no more than 7 alcoholic beverages per week,  spread out throughout the week.  Avoid "binge" drinking or consuming large quantities of alcohol in one setting.   Please let me know if you feel you may need help with reduction or quitting alcohol consumption.   Avoidance of nicotine, if used.  Please let me know if you feel you may need help with reduction or quitting nicotine use.   Daily mental health attention.  This can be in the form of 5 minute daily meditation, prayer, journaling, yoga, reflection, etc.   Purposeful attention to your emotions and mental state can significantly improve your overall wellbeing  and  Health.  Please know that I am here to help you with all of your health care goals and am happy to work with you to find a solution that works best for you.  The greatest advice I have received with any changes in life are to take it one step at a time, that even means if all you can focus on is the next 60 seconds, then do that and celebrate your victories.  With any changes in life, you will have set backs, and that is OK. The important thing to remember is, if you have a set back, it is not a failure, it is an opportunity to try again!  Health Maintenance Recommendations Screening Testing Mammogram Every 1 -2 years based on history and risk factors Starting at age 60 Pap Smear Ages 21-39 every 3  years Ages 29-65 every 5 years with HPV testing More frequent testing may be required based on results and history Colon Cancer Screening Every 1-10 years based on test performed, risk factors, and history Starting at age 38 Bone Density Screening Every 2-10 years based on history Starting at age 70 for women Recommendations for men differ based on medication usage, history, and risk factors AAA Screening One time ultrasound Men 63-85 years old who have every smoked Lung Cancer Screening Low Dose Lung CT every 12 months Age 69-80 years with a 30 pack-year smoking history who still smoke or who have quit within the  last 15 years  Screening Labs Routine  Labs: Complete Blood Count (CBC), Complete Metabolic Panel (CMP), Cholesterol (Lipid Panel) Every 6-12 months based on history and medications May be recommended more frequently based on current conditions or previous results Hemoglobin A1c Lab Every 3-12 months based on history and previous results Starting at age 86 or earlier with diagnosis of diabetes, high cholesterol, BMI >26, and/or risk factors Frequent monitoring for patients with diabetes to ensure blood sugar control Thyroid Panel (TSH w/ T3 & T4) Every 6 months based on history, symptoms, and risk factors May be repeated more often if on medication HIV One time testing for all patients 58 and older May be repeated more frequently for patients with increased risk factors or exposure Hepatitis C One time testing for all patients 65 and older May be repeated more frequently for patients with increased risk factors or exposure Gonorrhea, Chlamydia Every 12 months for all sexually active persons 13-24 years Additional monitoring may be recommended for those who are considered high risk or who have symptoms PSA Men 31-4 years old with risk factors Additional screening may be recommended from age 63-69 based on risk factors, symptoms, and history  Vaccine Recommendations Tetanus Booster All adults every 10 years Flu Vaccine All patients 6 months and older every year COVID Vaccine All patients 12 years and older Initial dosing with booster May recommend additional booster based on age and health history HPV Vaccine 2 doses all patients age 64-26 Dosing may be considered for patients over 26 Shingles Vaccine (Shingrix) 2 doses all adults 55 years and older Pneumonia (Pneumovax 23) All adults 65 years and older May recommend earlier dosing based on health history Pneumonia (Prevnar 28) All adults 65 years and older Dosed 1 year after Pneumovax 23  Additional Screening, Testing,  and Vaccinations may be recommended on an individualized basis based on family history, health history, risk factors, and/or exposure.

## 2023-02-03 LAB — TSH: TSH: 1.63 u[IU]/mL (ref 0.450–4.500)

## 2023-02-05 DIAGNOSIS — Y999 Unspecified external cause status: Secondary | ICD-10-CM | POA: Diagnosis not present

## 2023-02-05 DIAGNOSIS — G8918 Other acute postprocedural pain: Secondary | ICD-10-CM | POA: Diagnosis not present

## 2023-02-05 DIAGNOSIS — X58XXXA Exposure to other specified factors, initial encounter: Secondary | ICD-10-CM | POA: Diagnosis not present

## 2023-02-05 DIAGNOSIS — S82841A Displaced bimalleolar fracture of right lower leg, initial encounter for closed fracture: Secondary | ICD-10-CM | POA: Diagnosis not present

## 2023-02-05 NOTE — Assessment & Plan Note (Signed)

## 2023-02-06 LAB — CBC WITH DIFFERENTIAL/PLATELET
Basophils Absolute: 0 10*3/uL (ref 0.0–0.2)
Basos: 1 %
EOS (ABSOLUTE): 0 10*3/uL (ref 0.0–0.4)
Eos: 0 %
Hematocrit: 39.7 % (ref 34.0–46.6)
Hemoglobin: 13.4 g/dL (ref 11.1–15.9)
Immature Grans (Abs): 0 10*3/uL (ref 0.0–0.1)
Immature Granulocytes: 0 %
Lymphocytes Absolute: 1.1 10*3/uL (ref 0.7–3.1)
Lymphs: 22 %
MCH: 30.9 pg (ref 26.6–33.0)
MCHC: 33.8 g/dL (ref 31.5–35.7)
MCV: 92 fL (ref 79–97)
Monocytes Absolute: 0.4 10*3/uL (ref 0.1–0.9)
Monocytes: 7 %
Neutrophils Absolute: 3.6 10*3/uL (ref 1.4–7.0)
Neutrophils: 70 %
Platelets: 305 10*3/uL (ref 150–450)
RBC: 4.34 x10E6/uL (ref 3.77–5.28)
RDW: 12.2 % (ref 11.7–15.4)
WBC: 5.2 10*3/uL (ref 3.4–10.8)

## 2023-02-06 LAB — CMP14+EGFR
ALT: 13 IU/L (ref 0–32)
AST: 16 IU/L (ref 0–40)
Albumin: 4.7 g/dL (ref 3.9–4.9)
Alkaline Phosphatase: 58 IU/L (ref 44–121)
BUN/Creatinine Ratio: 19 (ref 9–23)
BUN: 18 mg/dL (ref 6–24)
Bilirubin Total: 0.8 mg/dL (ref 0.0–1.2)
CO2: 26 mmol/L (ref 20–29)
Calcium: 9.7 mg/dL (ref 8.7–10.2)
Chloride: 101 mmol/L (ref 96–106)
Creatinine, Ser: 0.93 mg/dL (ref 0.57–1.00)
Globulin, Total: 2.1 g/dL (ref 1.5–4.5)
Glucose: 84 mg/dL (ref 70–99)
Potassium: 4.8 mmol/L (ref 3.5–5.2)
Sodium: 139 mmol/L (ref 134–144)
Total Protein: 6.8 g/dL (ref 6.0–8.5)
eGFR: 79 mL/min/{1.73_m2} (ref >59)

## 2023-02-06 LAB — LIPID PANEL
Chol/HDL Ratio: 2.6 ratio (ref 0.0–4.4)
Cholesterol, Total: 189 mg/dL (ref 100–199)
HDL: 73 mg/dL (ref >39)
LDL Chol Calc (NIH): 103 mg/dL — ABNORMAL HIGH (ref 0–99)
Triglycerides: 69 mg/dL (ref 0–149)
VLDL Cholesterol Cal: 13 mg/dL (ref 5–40)

## 2023-02-06 LAB — QUANTIFERON-TB GOLD PLUS
QuantiFERON Mitogen Value: >10 [IU]/mL
QuantiFERON Nil Value: 0.02 [IU]/mL
QuantiFERON TB1 Ag Value: 0.01 [IU]/mL
QuantiFERON TB2 Ag Value: 0.02 [IU]/mL
QuantiFERON-TB Gold Plus: NEGATIVE

## 2023-02-18 DIAGNOSIS — S82841D Displaced bimalleolar fracture of right lower leg, subsequent encounter for closed fracture with routine healing: Secondary | ICD-10-CM | POA: Diagnosis not present

## 2023-02-25 ENCOUNTER — Encounter: Payer: Self-pay | Admitting: Nurse Practitioner

## 2023-03-18 ENCOUNTER — Encounter: Payer: Commercial Managed Care - PPO | Admitting: Nurse Practitioner

## 2023-04-26 DIAGNOSIS — L719 Rosacea, unspecified: Secondary | ICD-10-CM | POA: Diagnosis not present

## 2023-04-26 DIAGNOSIS — L821 Other seborrheic keratosis: Secondary | ICD-10-CM | POA: Diagnosis not present

## 2023-05-25 DIAGNOSIS — S82841D Displaced bimalleolar fracture of right lower leg, subsequent encounter for closed fracture with routine healing: Secondary | ICD-10-CM | POA: Diagnosis not present

## 2023-07-28 DIAGNOSIS — L719 Rosacea, unspecified: Secondary | ICD-10-CM | POA: Diagnosis not present

## 2023-11-19 DIAGNOSIS — D2271 Melanocytic nevi of right lower limb, including hip: Secondary | ICD-10-CM | POA: Diagnosis not present

## 2023-11-19 DIAGNOSIS — D225 Melanocytic nevi of trunk: Secondary | ICD-10-CM | POA: Diagnosis not present

## 2023-11-19 DIAGNOSIS — D224 Melanocytic nevi of scalp and neck: Secondary | ICD-10-CM | POA: Diagnosis not present

## 2023-11-19 DIAGNOSIS — Z86018 Personal history of other benign neoplasm: Secondary | ICD-10-CM | POA: Diagnosis not present

## 2023-11-19 DIAGNOSIS — L578 Other skin changes due to chronic exposure to nonionizing radiation: Secondary | ICD-10-CM | POA: Diagnosis not present

## 2023-11-19 DIAGNOSIS — D2261 Melanocytic nevi of right upper limb, including shoulder: Secondary | ICD-10-CM | POA: Diagnosis not present

## 2023-11-19 DIAGNOSIS — L821 Other seborrheic keratosis: Secondary | ICD-10-CM | POA: Diagnosis not present

## 2024-01-26 DIAGNOSIS — Z124 Encounter for screening for malignant neoplasm of cervix: Secondary | ICD-10-CM | POA: Diagnosis not present

## 2024-01-26 DIAGNOSIS — Z1231 Encounter for screening mammogram for malignant neoplasm of breast: Secondary | ICD-10-CM | POA: Diagnosis not present

## 2024-01-26 DIAGNOSIS — Z6822 Body mass index (BMI) 22.0-22.9, adult: Secondary | ICD-10-CM | POA: Diagnosis not present

## 2024-01-26 DIAGNOSIS — Z1151 Encounter for screening for human papillomavirus (HPV): Secondary | ICD-10-CM | POA: Diagnosis not present

## 2024-01-26 DIAGNOSIS — Z01419 Encounter for gynecological examination (general) (routine) without abnormal findings: Secondary | ICD-10-CM | POA: Diagnosis not present

## 2024-02-01 ENCOUNTER — Other Ambulatory Visit (HOSPITAL_BASED_OUTPATIENT_CLINIC_OR_DEPARTMENT_OTHER): Payer: Self-pay

## 2024-02-01 DIAGNOSIS — M25551 Pain in right hip: Secondary | ICD-10-CM | POA: Diagnosis not present

## 2024-02-01 DIAGNOSIS — M545 Low back pain, unspecified: Secondary | ICD-10-CM | POA: Diagnosis not present

## 2024-02-01 MED ORDER — PREDNISONE 10 MG (21) PO TBPK
ORAL_TABLET | ORAL | 0 refills | Status: AC
  Filled 2024-02-01: qty 21, 6d supply, fill #0

## 2024-02-08 ENCOUNTER — Encounter: Payer: Self-pay | Admitting: Nurse Practitioner

## 2024-02-08 ENCOUNTER — Ambulatory Visit: Payer: Commercial Managed Care - PPO | Admitting: Nurse Practitioner

## 2024-02-08 VITALS — BP 120/74 | HR 64 | Ht 64.25 in | Wt 129.0 lb

## 2024-02-08 DIAGNOSIS — Z1321 Encounter for screening for nutritional disorder: Secondary | ICD-10-CM

## 2024-02-08 DIAGNOSIS — D225 Melanocytic nevi of trunk: Secondary | ICD-10-CM | POA: Insufficient documentation

## 2024-02-08 DIAGNOSIS — M5431 Sciatica, right side: Secondary | ICD-10-CM | POA: Diagnosis not present

## 2024-02-08 DIAGNOSIS — Z130 Encounter for screening for diseases of the blood and blood-forming organs and certain disorders involving the immune mechanism: Secondary | ICD-10-CM

## 2024-02-08 DIAGNOSIS — Z13228 Encounter for screening for other metabolic disorders: Secondary | ICD-10-CM | POA: Diagnosis not present

## 2024-02-08 DIAGNOSIS — Z0000 Encounter for general adult medical examination without abnormal findings: Secondary | ICD-10-CM

## 2024-02-08 DIAGNOSIS — Z1329 Encounter for screening for other suspected endocrine disorder: Secondary | ICD-10-CM | POA: Diagnosis not present

## 2024-02-08 LAB — LIPID PANEL

## 2024-02-08 NOTE — Patient Instructions (Addendum)
 I will let you know if the Hepatitis B vaccines are truly recommended for you.   I recommend the flu shot a little later in October so that it will last through our typical flu season in [place].   I hope your back heals quickly and you are back to normal very soon! If you need anything from me please do not hesitate to reach out.    For all adult patients, I recommend A well balanced diet low in saturated fats, cholesterol, and moderation in carbohydrates.   This can be as simple as monitoring portion sizes and cutting back on sugary beverages such as soda and juice to start with.    Daily water consumption of at least 64 ounces.  Physical activity at least 180 minutes per week, if just starting out.   This can be as simple as taking the stairs instead of the elevator and walking 2-3 laps around the office  purposefully every day.   STD protection, partner selection, and regular testing if high risk.  Limited consumption of alcoholic beverages if alcohol is consumed.  For women, I recommend no more than 7 alcoholic beverages per week, spread out throughout the week.  Avoid binge drinking or consuming large quantities of alcohol in one setting.   Please let me know if you feel you may need help with reduction or quitting alcohol consumption.   Avoidance of nicotine, if used.  Please let me know if you feel you may need help with reduction or quitting nicotine use.   Daily mental health attention.  This can be in the form of 5 minute daily meditation, prayer, journaling, yoga, reflection, etc.   Purposeful attention to your emotions and mental state can significantly improve your overall wellbeing  and  Health.  Please know that I am here to help you with all of your health care goals and am happy to work with you to find a solution that works best for you.  The greatest advice I have received with any changes in life are to take it one step at a time, that even means if all you can  focus on is the next 60 seconds, then do that and celebrate your victories.  With any changes in life, you will have set backs, and that is OK. The important thing to remember is, if you have a set back, it is not a failure, it is an opportunity to try again!  Health Maintenance Recommendations Screening Testing Mammogram Every 1 -2 years based on history and risk factors Starting at age 49 Pap Smear Ages 21-39 every 3 years Ages 65-65 every 5 years with HPV testing More frequent testing may be required based on results and history Colon Cancer Screening Every 1-10 years based on test performed, risk factors, and history Starting at age 84 Bone Density Screening Every 2-10 years based on history Starting at age 13 for women Recommendations for men differ based on medication usage, history, and risk factors AAA Screening One time ultrasound Men 63-13 years old who have every smoked Lung Cancer Screening Low Dose Lung CT every 12 months Age 10-80 years with a 30 pack-year smoking history who still smoke or who have quit within the last 15 years  Screening Labs Routine  Labs: Complete Blood Count (CBC), Complete Metabolic Panel (CMP), Cholesterol (Lipid Panel) Every 6-12 months based on history and medications May be recommended more frequently based on current conditions or previous results Hemoglobin A1c Lab Every 3-12 months based  on history and previous results Starting at age 64 or earlier with diagnosis of diabetes, high cholesterol, BMI >26, and/or risk factors Frequent monitoring for patients with diabetes to ensure blood sugar control Thyroid  Panel (TSH w/ T3 & T4) Every 6 months based on history, symptoms, and risk factors May be repeated more often if on medication HIV One time testing for all patients 63 and older May be repeated more frequently for patients with increased risk factors or exposure Hepatitis C One time testing for all patients 50 and older May be  repeated more frequently for patients with increased risk factors or exposure Gonorrhea, Chlamydia Every 12 months for all sexually active persons 13-24 years Additional monitoring may be recommended for those who are considered high risk or who have symptoms PSA Men 76-5 years old with risk factors Additional screening may be recommended from age 31-69 based on risk factors, symptoms, and history  Vaccine Recommendations Tetanus Booster All adults every 10 years Flu Vaccine All patients 6 months and older every year COVID Vaccine All patients 12 years and older Initial dosing with booster May recommend additional booster based on age and health history HPV Vaccine 2 doses all patients age 50-26 Dosing may be considered for patients over 26 Shingles Vaccine (Shingrix) 2 doses all adults 55 years and older Pneumonia (Pneumovax 23) All adults 65 years and older May recommend earlier dosing based on health history Pneumonia (Prevnar 6) All adults 65 years and older Dosed 1 year after Pneumovax 23  Additional Screening, Testing, and Vaccinations may be recommended on an individualized basis based on family history, health history, risk factors, and/or exposure.

## 2024-02-08 NOTE — Assessment & Plan Note (Signed)
 CPE completed today. Review of HM activities and recommendations discussed and provided on AVS. Anticipatory guidance, diet, and exercise recommendations provided. Medications, allergies, and hx reviewed and updated as necessary. Orders placed as listed below.  Plan: - Labs ordered. Will make changes as necessary based on results.  - I will review these results and send recommendations via MyChart or a telephone call.  - Recommend flu vaccine in October.  - F/U with CPE in 1 year or sooner for acute/chronic health needs as directed.

## 2024-02-08 NOTE — Progress Notes (Signed)
 Carla Doing, DNP, AGNP-c [facility] [address] [place], [place] [address] Main Office [phone] VISIT TYPE: CPE on 02/08/2024 Today's Vitals   02/08/24 0818  BP: 120/74  Pulse: 64  Weight: 129 lb (58.5 kg)  Height: 5' 4.25 (1.632 m)   Body mass index is 21.97 kg/m. BP 120/74   Pulse 64   Ht 5' 4.25 (1.632 m)   Wt 129 lb (58.5 kg)   LMP 02/01/2024   BMI 21.97 kg/m   Subjective:    Patient ID: Carla Edwards, female    DOB: 1981/04/26, 43 y.o.   MRN: [id number]  HPI: History of Present Illness Carla Edwards is a 43 year old female who presents for an annual physical exam.  She has ongoing sciatica with pain radiating down her right leg, exacerbated by sitting, while walking and running provide relief. She is on day four of a six-day prednisone  pack, which has not yet alleviated her symptoms.  She has a history of a bimalleolar fracture in her right ankle last year, which required surgery. Post-surgery, she experienced significant nerve pain and was in a boot for a period. She resumed running in June after being cleared in March and is now able to run five miles without significant issues.  She experienced shingles last year, which was her first encounter with nerve pain. There are no residual issues from the shingles, but the nerve pain from her ankle and current back issues have been persistent.  No chest pain, shortness of breath, or changes in bowel or bladder habits. She reports regular menstrual cycles every 28 days with heavy flow initially. No swelling other than mild residual swelling in her right ankle.   Pertinent items are noted in HPI.  Most Recent Depression Screen:     02/08/2024    8:16 AM 02/02/2023    8:21 AM 10/29/2021    9:10 AM 01/25/2020   10:32 AM 10/29/2017    2:43 PM  Depression screen PHQ 2/9  Decreased Interest 0 0 0 0 0  Down, Depressed, Hopeless 0 0 0 0 0  PHQ - 2 Score 0 0 0 0 0  Altered sleeping   0     Tired, decreased energy   1    Change in appetite   0    Feeling bad or failure about yourself    0    Trouble concentrating   0    Suicidal thoughts   0    PHQ-9 Score   1    Difficult Edwards work/chores   Not difficult at all     Most Recent Anxiety Screen:      No data to display         Most Recent Fall Screen:    02/08/2024    8:16 AM 02/02/2023    8:21 AM 10/29/2021    9:09 AM 01/25/2020   10:33 AM  Fall Risk   Falls in the past year? 0 0 0 0  Number falls in past yr: 0 0 0   Injury with Fall? 0 0 0   Risk for fall due to : No Fall Risks No Fall Risks No Fall Risks No Fall Risks  Follow up Falls evaluation completed Falls evaluation completed Falls evaluation completed;Education provided       Data saved with a previous flowsheet row definition    Past medical history, surgical history, medications, allergies, family history and social history reviewed with patient today and changes made to appropriate  areas of the chart.  Past Medical History:  Past Medical History:  Diagnosis Date   AMA (advanced maternal age) multigravida 35+    Anxiety    Headache(784.0)    Hyperemesis    Missed ab    PONV (postoperative nausea and vomiting)    slow to awaken   Medications:  Current Outpatient Medications on File Prior to Visit  Medication Sig   Multiple Vitamin (MULTIVITAMIN) tablet Take 1 tablet by mouth daily.   predniSONE  (STERAPRED UNI-PAK 21 TAB) 10 MG (21) TBPK tablet Take as directed on packaging for 6 days   No current facility-administered medications on file prior to visit.   Surgical History:  Past Surgical History:  Procedure Laterality Date   DILATION AND EVACUATION N/A 09/13/2012   Procedure: DILATATION AND EVACUATION;  Surgeon: Truman Corona, MD;  Location: [facility];  Service: Gynecology;  Laterality: N/A;   KNEE SURGERY  1993   WISDOM TOOTH EXTRACTION  98   Allergies:  Allergies  Allergen Reactions   Sulfa Antibiotics Anaphylaxis    Tongue swelling    Lorcet 10-650 [Hydrocodone-Acetaminophen ]     Hydrocodone- Avoid Vicodin   Family History:  Family History  Problem Relation Age of Onset   Thyroid  disease Mother    Depression Father    Diabetes Father    Cancer Maternal Grandmother    Diabetes Maternal Grandfather    Hypertension Maternal Grandfather        Objective:    BP 120/74   Pulse 64   Ht 5' 4.25 (1.632 m)   Wt 129 lb (58.5 kg)   LMP 02/01/2024   BMI 21.97 kg/m   Wt Readings from Last 3 Encounters:  02/08/24 129 lb (58.5 kg)  02/02/23 124 lb 12.8 oz (56.6 kg)  12/18/22 127 lb 3.2 oz (57.7 kg)    Physical Exam Vitals and nursing note reviewed.  Constitutional:      General: She is not in acute distress.    Appearance: Normal appearance.  HENT:     Head: Normocephalic and atraumatic.     Right Ear: Hearing, tympanic membrane, ear canal and external ear normal.     Left Ear: Hearing, tympanic membrane, ear canal and external ear normal.     Nose: Nose normal.     Right Sinus: No maxillary sinus tenderness or frontal sinus tenderness.     Left Sinus: No maxillary sinus tenderness or frontal sinus tenderness.     Mouth/Throat:     Lips: Pink.     Mouth: Mucous membranes are moist.     Pharynx: Oropharynx is clear.  Eyes:     General: Lids are normal. Vision grossly intact.     Extraocular Movements: Extraocular movements intact.     Conjunctiva/sclera: Conjunctivae normal.     Pupils: Pupils are equal, round, and reactive to light.     Funduscopic exam:    Right eye: Red reflex present.        Left eye: Red reflex present.    Visual Fields: Right eye visual fields normal and left eye visual fields normal.  Neck:     Thyroid : No thyromegaly.     Vascular: No carotid bruit.  Cardiovascular:     Rate and Rhythm: Normal rate and regular rhythm.     Chest Wall: PMI is not displaced.     Pulses: Normal pulses.          Dorsalis pedis pulses are 2+ on the right side and 2+ on the left  side.        Posterior tibial pulses are 2+ on the right side and 2+ on the left side.     Heart sounds: Normal heart sounds. No murmur heard. Pulmonary:     Effort: Pulmonary effort is normal. No respiratory distress.     Breath sounds: Normal breath sounds.  Abdominal:     General: Abdomen is flat. Bowel sounds are normal. There is no distension.     Palpations: Abdomen is soft. There is no hepatomegaly, splenomegaly or mass.     Tenderness: There is no abdominal tenderness. There is no right CVA tenderness, left CVA tenderness, guarding or rebound.  Musculoskeletal:        General: Normal range of motion.     Cervical back: Full passive range of motion without pain, normal range of motion and neck supple. No tenderness.     Right lower leg: No edema.     Left lower leg: No edema.     Comments: Right sided sciatic and lumbar pain. Treated with prednisone  at this time.   Feet:     Left foot:     Toenail Condition: Left toenails are normal.  Lymphadenopathy:     Cervical: No cervical adenopathy.     Upper Body:     Right upper body: No supraclavicular adenopathy.     Left upper body: No supraclavicular adenopathy.  Skin:    General: Skin is warm and dry.     Capillary Refill: Capillary refill takes less than 2 seconds.     Nails: There is no clubbing.  Neurological:     General: No focal deficit present.     Mental Status: She is alert and oriented to person, place, and time.     GCS: GCS eye subscore is 4. GCS verbal subscore is 5. GCS motor subscore is 6.     Sensory: Sensation is intact.     Motor: Motor function is intact. No weakness.     Coordination: Coordination is intact.     Gait: Gait is intact.     Deep Tendon Reflexes: Reflexes are normal and symmetric.  Psychiatric:        Attention and Perception: Attention normal.        Mood and Affect: Mood normal.        Speech: Speech normal.        Behavior: Behavior normal. Behavior is cooperative.        Thought Content: Thought  content normal.        Cognition and Memory: Cognition and memory normal.        Judgment: Judgment normal.      Results for orders placed or performed in visit on 02/25/23  HM PAP SMEAR   Collection Time: 06/19/20 12:00 AM  Result Value Ref Range   HM Pap smear normal    HPV, high-risk NEG   Results Console HPV   Collection Time: 06/19/20 12:00 AM  Result Value Ref Range   CHL HPV Negative        Assessment & Plan:   Problem List Items Addressed This Visit     Encounter for annual physical exam - Primary   CPE completed today. Review of HM activities and recommendations discussed and provided on AVS. Anticipatory guidance, diet, and exercise recommendations provided. Medications, allergies, and hx reviewed and updated as necessary. Orders placed as listed below.  Plan: - Labs ordered. Will make changes as necessary based on results.  - I will review  these results and send recommendations via MyChart or a telephone call.  - Recommend flu vaccine in October.  - F/U with CPE in 1 year or sooner for acute/chronic health needs as directed.        Relevant Orders   CBC with Differential/Platelet   CMP14+EGFR   Lipid panel   TSH   Sciatic nerve pain, right   Onset of low back pain with right-sided sciatica, likely due to bulging discs at L5-S1. Pain exacerbated by sitting, relieved by walking and running. Currently on day four of a six-day prednisone  pack. Previous steroid injection was ineffective. - Continue prednisone  pack - Order MRI of the lumbar spine to confirm diagnosis and guide treatment - Monitor blood sugar levels due to potential elevation from prednisone       Other Visit Diagnoses       Screening for endocrine, nutritional, metabolic and immunity disorder       Relevant Orders   CBC with Differential/Platelet   CMP14+EGFR   Lipid panel   TSH        Follow up plan: Return in about 1 year (around 02/07/2025) for CPE.  NEXT PREVENTATIVE PHYSICAL DUE  IN 1 YEAR.  PATIENT COUNSELING PROVIDED FOR ALL ADULT PATIENTS: A well balanced diet low in saturated fats, cholesterol, and moderation in carbohydrates.  This can be as simple as monitoring portion sizes and cutting back on sugary beverages such as soda and juice to start with.    Daily water consumption of at least 64 ounces.  Physical activity at least 180 minutes per week.  If just starting out, start 10 minutes a day and work your way up.   This can be as simple as taking the stairs instead of the elevator and walking 2-3 laps around the office  purposefully every day.   STD protection, partner selection, and regular testing if high risk.  Limited consumption of alcoholic beverages if alcohol is consumed. For men, I recommend no more than 14 alcoholic beverages per week, spread out throughout the week (max 2 per day). Avoid binge drinking or consuming large quantities of alcohol in one setting.  Please let me know if you feel you may need help with reduction or quitting alcohol consumption.   Avoidance of nicotine, if used. Please let me know if you feel you may need help with reduction or quitting nicotine use.   Daily mental health attention. This can be in the form of 5 minute daily meditation, prayer, journaling, yoga, reflection, etc.  Purposeful attention to your emotions and mental state can significantly improve your overall wellbeing  and  Health.  Please know that I am here to help you with all of your health care goals and am happy to work with you to find a solution that works best for you.  The greatest advice I have received with any changes in life are to take it one step at a time, that even means if all you can focus on is the next 60 seconds, then do that and celebrate your victories.  With any changes in life, you will have set backs, and that is OK. The important thing to remember is, if you have a set back, it is not a failure, it is an opportunity to try  again! Screening Testing Mammogram Every 1 -2 years based on history and risk factors Starting at age 34 Pap Smear Ages 21-39 every 3 years Ages 79-65 every 5 years with HPV testing More frequent testing may  be required based on results and history Colon Cancer Screening Every 1-10 years based on test performed, risk factors, and history Starting at age 82 Bone Density Screening Every 2-10 years based on history Starting at age 500 for women Recommendations for men differ based on medication usage, history, and risk factors AAA Screening One time ultrasound Men 47-79 years old who have every smoked Lung Cancer Screening Low Dose Lung CT every 12 months Age 78-80 years with a 30 pack-year smoking history who still smoke or who have quit within the last 15 years   Screening Labs Routine  Labs: Complete Blood Count (CBC), Complete Metabolic Panel (CMP), Cholesterol (Lipid Panel) Every 6-12 months based on history and medications May be recommended more frequently based on current conditions or previous results Hemoglobin A1c Lab Every 3-12 months based on history and previous results Starting at age 60 or earlier with diagnosis of diabetes, high cholesterol, BMI >26, and/or risk factors Frequent monitoring for patients with diabetes to ensure blood sugar control Thyroid  Panel (TSH) Every 6 months based on history, symptoms, and risk factors May be repeated more often if on medication HIV One time testing for all patients 20 and older May be repeated more frequently for patients with increased risk factors or exposure Hepatitis C One time testing for all patients 20 and older May be repeated more frequently for patients with increased risk factors or exposure Gonorrhea, Chlamydia Every 12 months for all sexually active persons 13-24 years Additional monitoring may be recommended for those who are considered high risk or who have symptoms Every 12 months for any woman on birth  control, regardless of sexual activity PSA Men 5-51 years old with risk factors Additional screening may be recommended from age 31-69 based on risk factors, symptoms, and history  Vaccine Recommendations Tetanus Booster All adults every 10 years Flu Vaccine All patients 6 months and older every year COVID Vaccine All patients 12 years and older Initial dosing with booster May recommend additional booster based on age and health history HPV Vaccine 2 doses all patients age 50-26 Dosing may be considered for patients over 26 Shingles Vaccine (Shingrix) 2 doses all adults 55 years and older Pneumonia (Pneumovax 77) All adults 65 years and older May recommend earlier dosing based on health history One year apart from Prevnar 35 Pneumonia (Prevnar 62) All adults 65 years and older Dosed 1 year after Pneumovax 23 Pneumonia (Prevnar 20) One time alternative to the two dosing of 13 and 23 For all adults with initial dose of 23, 20 is recommended 1 year later For all adults with initial dose of 13, 23 is still recommended as second option 1 year later

## 2024-02-08 NOTE — Assessment & Plan Note (Signed)
 Onset of low back pain with right-sided sciatica, likely due to bulging discs at L5-S1. Pain exacerbated by sitting, relieved by walking and running. Currently on day four of a six-day prednisone  pack. Previous steroid injection was ineffective. - Continue prednisone  pack - Order MRI of the lumbar spine to confirm diagnosis and guide treatment - Monitor blood sugar levels due to potential elevation from prednisone 

## 2024-02-09 LAB — CMP14+EGFR
ALT: 15 IU/L (ref 0–32)
AST: 14 IU/L (ref 0–40)
Albumin: 4.9 g/dL (ref 3.9–4.9)
Alkaline Phosphatase: 71 IU/L (ref 44–121)
BUN/Creatinine Ratio: 26 — AB (ref 9–23)
BUN: 24 mg/dL (ref 6–24)
Bilirubin Total: 0.5 mg/dL (ref 0.0–1.2)
CO2: 22 mmol/L (ref 20–29)
Calcium: 10.3 mg/dL — AB (ref 8.7–10.2)
Chloride: 99 mmol/L (ref 96–106)
Creatinine, Ser: 0.93 mg/dL (ref 0.57–1.00)
Globulin, Total: 2.4 g/dL (ref 1.5–4.5)
Glucose: 128 mg/dL — AB (ref 70–99)
Potassium: 4.7 mmol/L (ref 3.5–5.2)
Sodium: 138 mmol/L (ref 134–144)
Total Protein: 7.3 g/dL (ref 6.0–8.5)
eGFR: 78 mL/min/1.73 (ref >59)

## 2024-02-09 LAB — CBC WITH DIFFERENTIAL/PLATELET
Basophils Absolute: 0 x10E3/uL (ref 0.0–0.2)
Basos: 0 %
EOS (ABSOLUTE): 0 x10E3/uL (ref 0.0–0.4)
Eos: 0 %
Hematocrit: 42.7 % (ref 34.0–46.6)
Hemoglobin: 14.2 g/dL (ref 11.1–15.9)
Immature Grans (Abs): 0.1 x10E3/uL (ref 0.0–0.1)
Immature Granulocytes: 1 %
Lymphocytes Absolute: 0.7 x10E3/uL (ref 0.7–3.1)
Lymphs: 5 %
MCH: 32.3 pg (ref 26.6–33.0)
MCHC: 33.3 g/dL (ref 31.5–35.7)
MCV: 97 fL (ref 79–97)
Monocytes Absolute: 0.7 x10E3/uL (ref 0.1–0.9)
Monocytes: 5 %
Neutrophils Absolute: 12.2 x10E3/uL — ABNORMAL HIGH (ref 1.4–7.0)
Neutrophils: 89 %
Platelets: 426 x10E3/uL (ref 150–450)
RBC: 4.39 x10E6/uL (ref 3.77–5.28)
RDW: 12.1 % (ref 11.7–15.4)
WBC: 13.7 x10E3/uL — ABNORMAL HIGH (ref 3.4–10.8)

## 2024-02-09 LAB — LIPID PANEL
Cholesterol, Total: 221 mg/dL — AB (ref 100–199)
HDL: 74 mg/dL (ref >39)
LDL CALC COMMENT:: 3 ratio (ref 0.0–4.4)
LDL Chol Calc (NIH): 131 mg/dL — AB (ref 0–99)
Triglycerides: 91 mg/dL (ref 0–149)
VLDL Cholesterol Cal: 16 mg/dL (ref 5–40)

## 2024-02-09 LAB — TSH: TSH: 0.93 u[IU]/mL (ref 0.450–4.500)

## 2024-02-16 ENCOUNTER — Ambulatory Visit: Payer: Self-pay | Admitting: Nurse Practitioner

## 2024-02-16 DIAGNOSIS — D72828 Other elevated white blood cell count: Secondary | ICD-10-CM

## 2024-02-21 DIAGNOSIS — M545 Low back pain, unspecified: Secondary | ICD-10-CM | POA: Diagnosis not present

## 2024-03-15 ENCOUNTER — Other Ambulatory Visit

## 2024-03-15 DIAGNOSIS — D72828 Other elevated white blood cell count: Secondary | ICD-10-CM | POA: Diagnosis not present

## 2024-03-16 LAB — PTH, INTACT AND CALCIUM
Calcium: 9.7 mg/dL (ref 8.7–10.2)
PTH: 34 pg/mL (ref 15–65)

## 2024-03-16 LAB — CBC WITH DIFFERENTIAL/PLATELET
Basophils Absolute: 0.1 x10E3/uL (ref 0.0–0.2)
Basos: 1 %
EOS (ABSOLUTE): 0 x10E3/uL (ref 0.0–0.4)
Eos: 0 %
Hematocrit: 42.9 % (ref 34.0–46.6)
Hemoglobin: 14 g/dL (ref 11.1–15.9)
Immature Grans (Abs): 0 x10E3/uL (ref 0.0–0.1)
Immature Granulocytes: 0 %
Lymphocytes Absolute: 1.4 x10E3/uL (ref 0.7–3.1)
Lymphs: 20 %
MCH: 31.8 pg (ref 26.6–33.0)
MCHC: 32.6 g/dL (ref 31.5–35.7)
MCV: 98 fL — ABNORMAL HIGH (ref 79–97)
Monocytes Absolute: 0.5 x10E3/uL (ref 0.1–0.9)
Monocytes: 8 %
Neutrophils Absolute: 4.8 x10E3/uL (ref 1.4–7.0)
Neutrophils: 70 %
Platelets: 326 x10E3/uL (ref 150–450)
RBC: 4.4 x10E6/uL (ref 3.77–5.28)
RDW: 12.4 % (ref 11.7–15.4)
WBC: 6.8 x10E3/uL (ref 3.4–10.8)

## 2024-03-23 ENCOUNTER — Ambulatory Visit: Payer: Self-pay | Admitting: Nurse Practitioner

## 2024-03-29 DIAGNOSIS — Z23 Encounter for immunization: Secondary | ICD-10-CM | POA: Diagnosis not present

## 2024-04-12 DIAGNOSIS — M5416 Radiculopathy, lumbar region: Secondary | ICD-10-CM | POA: Diagnosis not present

## 2025-02-26 ENCOUNTER — Encounter: Payer: Self-pay | Admitting: Nurse Practitioner
